# Patient Record
Sex: Female | Born: 1955 | Hispanic: No | Marital: Married | Smoking: Never smoker
Health system: Southern US, Community
[De-identification: ages and names within clinical notes are randomized; demographics above are authoritative.]

## PROBLEM LIST (undated history)

## (undated) DIAGNOSIS — E785 Hyperlipidemia, unspecified: Secondary | ICD-10-CM

## (undated) DIAGNOSIS — I10 Essential (primary) hypertension: Secondary | ICD-10-CM

## (undated) DIAGNOSIS — M810 Age-related osteoporosis without current pathological fracture: Secondary | ICD-10-CM

## (undated) DIAGNOSIS — K5792 Diverticulitis of intestine, part unspecified, without perforation or abscess without bleeding: Secondary | ICD-10-CM

## (undated) DIAGNOSIS — K219 Gastro-esophageal reflux disease without esophagitis: Secondary | ICD-10-CM

## (undated) DIAGNOSIS — G4730 Sleep apnea, unspecified: Secondary | ICD-10-CM

## (undated) DIAGNOSIS — I5189 Other ill-defined heart diseases: Secondary | ICD-10-CM

## (undated) DIAGNOSIS — N200 Calculus of kidney: Secondary | ICD-10-CM

## (undated) DIAGNOSIS — D069 Carcinoma in situ of cervix, unspecified: Secondary | ICD-10-CM

## (undated) HISTORY — PX: TONSILLECTOMY: SHX5217

## (undated) HISTORY — DX: Calculus of kidney: N20.0

## (undated) HISTORY — DX: Age-related osteoporosis without current pathological fracture: M81.0

## (undated) HISTORY — PX: OTHER SURGICAL HISTORY: SHX169

## (undated) HISTORY — DX: Essential (primary) hypertension: I10

## (undated) HISTORY — PX: EYE SURGERY: SHX253

## (undated) HISTORY — DX: Other ill-defined heart diseases: I51.89

## (undated) HISTORY — DX: Sleep apnea, unspecified: G47.30

## (undated) HISTORY — DX: Gastro-esophageal reflux disease without esophagitis: K21.9

## (undated) HISTORY — PX: ECTOPIC PREGNANCY SURGERY: SHX613

## (undated) HISTORY — DX: Carcinoma in situ of cervix, unspecified: D06.9

---

## 1987-07-29 DIAGNOSIS — D069 Carcinoma in situ of cervix, unspecified: Secondary | ICD-10-CM

## 1987-07-29 HISTORY — DX: Carcinoma in situ of cervix, unspecified: D06.9

## 1987-07-29 HISTORY — PX: CERVICAL CONE BIOPSY: SUR198

## 2008-01-24 ENCOUNTER — Other Ambulatory Visit: Admission: RE | Admit: 2008-01-24 | Discharge: 2008-01-24 | Payer: Self-pay | Admitting: Gynecology

## 2008-03-28 ENCOUNTER — Ambulatory Visit: Payer: Self-pay | Admitting: Gynecology

## 2008-07-18 ENCOUNTER — Emergency Department (HOSPITAL_BASED_OUTPATIENT_CLINIC_OR_DEPARTMENT_OTHER): Admission: EM | Admit: 2008-07-18 | Discharge: 2008-07-18 | Payer: Self-pay | Admitting: Emergency Medicine

## 2008-07-18 ENCOUNTER — Ambulatory Visit: Payer: Self-pay | Admitting: Diagnostic Radiology

## 2009-01-31 ENCOUNTER — Other Ambulatory Visit: Admission: RE | Admit: 2009-01-31 | Discharge: 2009-01-31 | Payer: Self-pay | Admitting: Gynecology

## 2009-01-31 ENCOUNTER — Encounter: Payer: Self-pay | Admitting: Gynecology

## 2009-01-31 ENCOUNTER — Ambulatory Visit: Payer: Self-pay | Admitting: Gynecology

## 2010-02-06 ENCOUNTER — Ambulatory Visit: Payer: Self-pay | Admitting: Gynecology

## 2010-02-06 ENCOUNTER — Other Ambulatory Visit: Admission: RE | Admit: 2010-02-06 | Discharge: 2010-02-06 | Payer: Self-pay | Admitting: Gynecology

## 2010-04-02 ENCOUNTER — Encounter: Admission: RE | Admit: 2010-04-02 | Discharge: 2010-04-02 | Payer: Self-pay | Admitting: Gastroenterology

## 2010-12-25 ENCOUNTER — Other Ambulatory Visit: Payer: Self-pay | Admitting: Orthopedic Surgery

## 2010-12-25 DIAGNOSIS — M25512 Pain in left shoulder: Secondary | ICD-10-CM

## 2010-12-29 ENCOUNTER — Other Ambulatory Visit: Payer: Self-pay

## 2010-12-31 ENCOUNTER — Ambulatory Visit: Payer: BC Managed Care – PPO | Attending: Orthopedic Surgery | Admitting: Rehabilitation

## 2010-12-31 DIAGNOSIS — IMO0001 Reserved for inherently not codable concepts without codable children: Secondary | ICD-10-CM | POA: Insufficient documentation

## 2010-12-31 DIAGNOSIS — M6281 Muscle weakness (generalized): Secondary | ICD-10-CM | POA: Insufficient documentation

## 2011-01-01 ENCOUNTER — Ambulatory Visit
Admission: RE | Admit: 2011-01-01 | Discharge: 2011-01-01 | Disposition: A | Discharge status: Home / Self Care | Payer: BC Managed Care – PPO | Source: Ambulatory Visit | Attending: Orthopedic Surgery | Admitting: Orthopedic Surgery

## 2011-01-01 DIAGNOSIS — M25512 Pain in left shoulder: Secondary | ICD-10-CM

## 2011-01-08 ENCOUNTER — Ambulatory Visit: Payer: BC Managed Care – PPO | Admitting: Physical Therapy

## 2011-01-22 ENCOUNTER — Ambulatory Visit: Payer: BC Managed Care – PPO | Admitting: Physical Therapy

## 2011-02-10 ENCOUNTER — Encounter (INDEPENDENT_AMBULATORY_CARE_PROVIDER_SITE_OTHER): Payer: BC Managed Care – PPO | Admitting: Gynecology

## 2011-02-10 ENCOUNTER — Other Ambulatory Visit (HOSPITAL_COMMUNITY)
Admission: RE | Admit: 2011-02-10 | Discharge: 2011-02-10 | Disposition: A | Discharge status: Home / Self Care | Payer: BC Managed Care – PPO | Source: Ambulatory Visit | Attending: Gynecology | Admitting: Gynecology

## 2011-02-10 ENCOUNTER — Other Ambulatory Visit: Payer: Self-pay | Admitting: Gynecology

## 2011-02-10 ENCOUNTER — Encounter: Payer: Self-pay | Admitting: Gynecology

## 2011-02-10 DIAGNOSIS — Z124 Encounter for screening for malignant neoplasm of cervix: Secondary | ICD-10-CM | POA: Insufficient documentation

## 2011-02-10 DIAGNOSIS — Z01419 Encounter for gynecological examination (general) (routine) without abnormal findings: Secondary | ICD-10-CM

## 2011-02-10 DIAGNOSIS — B373 Candidiasis of vulva and vagina: Secondary | ICD-10-CM

## 2011-03-04 ENCOUNTER — Ambulatory Visit: Payer: BC Managed Care – PPO | Attending: Orthopedic Surgery | Admitting: Physical Therapy

## 2011-03-04 DIAGNOSIS — M6281 Muscle weakness (generalized): Secondary | ICD-10-CM | POA: Insufficient documentation

## 2011-03-04 DIAGNOSIS — IMO0001 Reserved for inherently not codable concepts without codable children: Secondary | ICD-10-CM | POA: Insufficient documentation

## 2011-03-05 ENCOUNTER — Encounter (HOSPITAL_BASED_OUTPATIENT_CLINIC_OR_DEPARTMENT_OTHER): Payer: Self-pay

## 2011-03-05 ENCOUNTER — Inpatient Hospital Stay (HOSPITAL_COMMUNITY)
Admission: AD | Admit: 2011-03-05 | Discharge: 2011-03-12 | DRG: 541 | Disposition: A | Discharge status: Home / Self Care | Payer: BC Managed Care – PPO | Source: Other Acute Inpatient Hospital | Attending: Internal Medicine | Admitting: Internal Medicine

## 2011-03-05 ENCOUNTER — Emergency Department (INDEPENDENT_AMBULATORY_CARE_PROVIDER_SITE_OTHER): Payer: BC Managed Care – PPO

## 2011-03-05 ENCOUNTER — Other Ambulatory Visit: Payer: Self-pay

## 2011-03-05 ENCOUNTER — Encounter: Payer: BC Managed Care – PPO | Admitting: Physical Therapy

## 2011-03-05 ENCOUNTER — Emergency Department (HOSPITAL_BASED_OUTPATIENT_CLINIC_OR_DEPARTMENT_OTHER)
Admission: EM | Admit: 2011-03-05 | Discharge: 2011-03-05 | Disposition: A | Discharge status: Other Acute Inpatient Hospital | Payer: BC Managed Care – PPO | Source: Home / Self Care | Attending: Emergency Medicine | Admitting: Emergency Medicine

## 2011-03-05 DIAGNOSIS — R4182 Altered mental status, unspecified: Secondary | ICD-10-CM

## 2011-03-05 DIAGNOSIS — E119 Type 2 diabetes mellitus without complications: Secondary | ICD-10-CM | POA: Diagnosis present

## 2011-03-05 DIAGNOSIS — E785 Hyperlipidemia, unspecified: Secondary | ICD-10-CM | POA: Insufficient documentation

## 2011-03-05 DIAGNOSIS — R404 Transient alteration of awareness: Secondary | ICD-10-CM | POA: Diagnosis present

## 2011-03-05 DIAGNOSIS — F29 Unspecified psychosis not due to a substance or known physiological condition: Secondary | ICD-10-CM

## 2011-03-05 DIAGNOSIS — I5031 Acute diastolic (congestive) heart failure: Secondary | ICD-10-CM | POA: Diagnosis present

## 2011-03-05 DIAGNOSIS — J189 Pneumonia, unspecified organism: Principal | ICD-10-CM | POA: Diagnosis present

## 2011-03-05 DIAGNOSIS — N179 Acute kidney failure, unspecified: Secondary | ICD-10-CM | POA: Diagnosis present

## 2011-03-05 DIAGNOSIS — F341 Dysthymic disorder: Secondary | ICD-10-CM | POA: Diagnosis present

## 2011-03-05 DIAGNOSIS — R4789 Other speech disturbances: Secondary | ICD-10-CM | POA: Insufficient documentation

## 2011-03-05 DIAGNOSIS — K219 Gastro-esophageal reflux disease without esophagitis: Secondary | ICD-10-CM | POA: Diagnosis present

## 2011-03-05 DIAGNOSIS — D069 Carcinoma in situ of cervix, unspecified: Secondary | ICD-10-CM | POA: Insufficient documentation

## 2011-03-05 DIAGNOSIS — J9600 Acute respiratory failure, unspecified whether with hypoxia or hypercapnia: Secondary | ICD-10-CM | POA: Insufficient documentation

## 2011-03-05 DIAGNOSIS — I509 Heart failure, unspecified: Secondary | ICD-10-CM | POA: Diagnosis present

## 2011-03-05 DIAGNOSIS — Z9181 History of falling: Secondary | ICD-10-CM

## 2011-03-05 DIAGNOSIS — I10 Essential (primary) hypertension: Secondary | ICD-10-CM | POA: Diagnosis present

## 2011-03-05 DIAGNOSIS — R0902 Hypoxemia: Secondary | ICD-10-CM | POA: Diagnosis present

## 2011-03-05 DIAGNOSIS — Y95 Nosocomial condition: Secondary | ICD-10-CM

## 2011-03-05 DIAGNOSIS — Z4889 Encounter for other specified surgical aftercare: Secondary | ICD-10-CM

## 2011-03-05 HISTORY — DX: Hyperlipidemia, unspecified: E78.5

## 2011-03-05 LAB — URINALYSIS, ROUTINE W REFLEX MICROSCOPIC
Bilirubin Urine: NEGATIVE
Glucose, UA: NEGATIVE mg/dL
Hgb urine dipstick: NEGATIVE
Ketones, ur: NEGATIVE mg/dL
Leukocytes, UA: NEGATIVE
Nitrite: NEGATIVE
Protein, ur: NEGATIVE mg/dL
Specific Gravity, Urine: 1.014 (ref 1.005–1.030)
Urobilinogen, UA: 0.2 mg/dL (ref 0.0–1.0)
pH: 5.5 (ref 5.0–8.0)

## 2011-03-05 LAB — COMPREHENSIVE METABOLIC PANEL WITH GFR
ALT: 18 U/L (ref 0–35)
AST: 23 U/L (ref 0–37)
Albumin: 3.4 g/dL — ABNORMAL LOW (ref 3.5–5.2)
Alkaline Phosphatase: 99 U/L (ref 39–117)
BUN: 21 mg/dL (ref 6–23)
CO2: 25 meq/L (ref 19–32)
Calcium: 9.3 mg/dL (ref 8.4–10.5)
Chloride: 102 meq/L (ref 96–112)
Creatinine, Ser: 1.4 mg/dL — ABNORMAL HIGH (ref 0.50–1.10)
GFR calc Af Amer: 47 mL/min — ABNORMAL LOW (ref >60)
GFR calc non Af Amer: 39 mL/min — ABNORMAL LOW (ref >60)
Glucose, Bld: 117 mg/dL — ABNORMAL HIGH (ref 70–99)
Potassium: 3.8 meq/L (ref 3.5–5.1)
Sodium: 138 meq/L (ref 135–145)
Total Bilirubin: 0.3 mg/dL (ref 0.3–1.2)
Total Protein: 7.1 g/dL (ref 6.0–8.3)

## 2011-03-05 LAB — CBC
HCT: 36.3 % (ref 36.0–46.0)
Hemoglobin: 11.3 g/dL — ABNORMAL LOW (ref 12.0–15.0)
MCH: 27.4 pg (ref 26.0–34.0)
MCHC: 31.1 g/dL (ref 30.0–36.0)
MCV: 87.9 fL (ref 78.0–100.0)
Platelets: 247 K/uL (ref 150–400)
RBC: 4.13 MIL/uL (ref 3.87–5.11)
RDW: 17.4 % — ABNORMAL HIGH (ref 11.5–15.5)
WBC: 12.9 K/uL — ABNORMAL HIGH (ref 4.0–10.5)

## 2011-03-05 LAB — POCT I-STAT 3, ART BLOOD GAS (G3+)
Acid-Base Excess: 1 mmol/L (ref 0.0–2.0)
Bicarbonate: 26.3 mEq/L — ABNORMAL HIGH (ref 20.0–24.0)
Bicarbonate: 27 mEq/L — ABNORMAL HIGH (ref 20.0–24.0)
O2 Saturation: 90 %
Patient temperature: 99.3
TCO2: 28 mmol/L (ref 0–100)
TCO2: 28 mmol/L (ref 0–100)
pCO2 arterial: 51.2 mmHg — ABNORMAL HIGH (ref 35.0–45.0)
pH, Arterial: 7.322 — ABNORMAL LOW (ref 7.350–7.400)
pO2, Arterial: 66 mmHg — ABNORMAL LOW (ref 80.0–100.0)
pO2, Arterial: 78 mmHg — ABNORMAL LOW (ref 80.0–100.0)

## 2011-03-05 LAB — CK TOTAL AND CKMB (NOT AT ARMC): CK, MB: 5.3 ng/mL — ABNORMAL HIGH (ref 0.3–4.0)

## 2011-03-05 MED ORDER — SODIUM CHLORIDE 0.9 % IV SOLN: INTRAVENOUS | Status: DC

## 2011-03-05 MED ORDER — PIPERACILLIN-TAZOBACTAM 3.375 G IVPB
3.3750 g | Freq: Once | INTRAVENOUS | Status: AC
  Administered 2011-03-05: 3.375 g via INTRAVENOUS
  Filled 2011-03-05: qty 50

## 2011-03-05 MED ORDER — ONDANSETRON HCL 4 MG/2ML IJ SOLN
4.0000 mg | Freq: Once | INTRAMUSCULAR | Status: AC
  Administered 2011-03-05: 4 mg via INTRAVENOUS

## 2011-03-05 MED ORDER — VANCOMYCIN HCL IN DEXTROSE 1-5 GM/200ML-% IV SOLN
1000.0000 mg | Freq: Once | INTRAVENOUS | Status: AC
  Administered 2011-03-05: 1000 mg via INTRAVENOUS
  Filled 2011-03-05: qty 200

## 2011-03-05 MED ORDER — ONDANSETRON HCL 4 MG/2ML IJ SOLN
INTRAMUSCULAR | Status: AC
  Administered 2011-03-05: 4 mg via INTRAVENOUS
  Filled 2011-03-05: qty 2

## 2011-03-05 NOTE — ED Notes (Signed)
Returned from radiology.  Pt remains in NSR.  O2 increased to 4LNC.  Pt follows simple commands.  No change in condition otherwise. Family at bedside and informed of plan of care.  RRT at bedside for ABG.

## 2011-03-05 NOTE — ED Notes (Signed)
IV was charted "removed" for documentation purposes only. IV is infact intact at time of transfer. SL. Flushes well.

## 2011-03-05 NOTE — ED Notes (Signed)
Patient is resting comfortably. 

## 2011-03-05 NOTE — ED Notes (Signed)
Report given to [facility] and husband signed transfer consent form.

## 2011-03-05 NOTE — ED Notes (Signed)
Blood culture #2 collected by RRT via ABG specimen.  Pt tolerated well.  IV ABX started.

## 2011-03-05 NOTE — ED Notes (Signed)
Report received from [facility]. Assuming care of pt at this time. Pt is lying in bed with family members at side. Pt remains disoriented to time, place and situation. Pt does recognize family members. Vitals stable. Warm blankets applied. Pt/family aware of plans for admission.

## 2011-03-05 NOTE — ED Notes (Signed)
Pt transported by [facility] to room 2927

## 2011-03-05 NOTE — ED Notes (Signed)
Unable to call report at his time d/t unit nurse is unavailable. Nurse requested to call back in approx 15-90min.

## 2011-03-05 NOTE — ED Notes (Signed)
Family called staff to room stating pt was vomiting. Pt had small amt emesis in the bag. HOB elevated. Pt states she feels nauseous. Md made aware and orders given. zofran administered. Will continue to monitor. Pt feels warm to touch. Will check temp once vomiting subsides.

## 2011-03-05 NOTE — ED Notes (Signed)
Pt resting comfortably and family at bedside.  NSR per monitor.  Awaiting admission. Pt repositioned for comfort.

## 2011-03-05 NOTE — ED Notes (Signed)
Report given to RN on 2900.

## 2011-03-05 NOTE — ED Notes (Signed)
Dressing to left shoulder CDI and sling in place.  Radial pulse palpable.

## 2011-03-05 NOTE — ED Notes (Signed)
MD at bedside. Blood Culture x 1 collected.  Pt and family at bedside and informed of plan of care. Awaiting bed assignment.

## 2011-03-05 NOTE — ED Notes (Signed)
Call placed to [facility] hospitalist via [facility]

## 2011-03-05 NOTE — ED Notes (Signed)
Family reports pt is s/p left shoulder surgery Monday and has been home recovering.  Husbanfd reports last dose of Percocet was at 0600 this am and pt was last known well at approximately 1000.  Pt was awakened at 1300 for therapy and was confused.

## 2011-03-05 NOTE — ED Notes (Signed)
NSR per cardiac monitor.  Pt resting comfortably and no change in condition.

## 2011-03-05 NOTE — ED Provider Notes (Addendum)
History     CSN: [id number] Arrival date & time: 03/05/2011  2:45 PM  Chief Complaint  Patient presents with  . Altered Mental Status   The history is provided by the patient, the spouse and a relative.  S/P SHOULDER SURGERY ON Monday REQUIRING OVERNIGHT STAY BY DR MURPHY. TODAY AT 0600 NOTED TO BE CONFUSED BUT THOUGHT MAYBE PAIN MEDS BY AFTERNOON WITHOUT PAIN MEDS STILL CONFUSED. NO OTHER SYMPTOMS.  NO CP NO SOB, NO FEVER, NO NEURO DEFICIT.   Past Medical History  Diagnosis Date  . GERD (gastroesophageal reflux disease)   . Hypertension   . Diabetes mellitus   . CIS (carcinoma in situ of cervix) 1989    [facility] BX WITH DR. MCPHAIL  . Ectopic pregnancy 1985  . Diabetes mellitus   . Hyperlipemia     Past Surgical History  Procedure Date  . Eye surgery [id number]  . Cervical cone biopsy 1989     DR. MCPHAIL  . Left shoulder surgery   . Eye surgery   . Cesarean section   . Tonsillectomy     History reviewed. No pertinent family history.  History  Substance Use Topics  . Smoking status: Unknown If Ever Smoked  . Smokeless tobacco: Not on file  . Alcohol Use: No    OB History    Grav Para Term Preterm Abortions TAB SAB Ect Mult Living                  Review of Systems  Constitutional: Negative for fever.  HENT: Negative for congestion and neck pain.   Eyes: Negative for redness and visual disturbance.  Respiratory: Negative for cough, shortness of breath and wheezing.   Cardiovascular: Negative for chest pain and leg swelling.  Gastrointestinal: Negative for nausea, vomiting and abdominal pain.  Genitourinary: Negative for dysuria.  Musculoskeletal: Negative for myalgias and back pain.  Skin: Negative for rash.  Neurological: Positive for speech difficulty. Negative for seizures, weakness, numbness and headaches.  Psychiatric/Behavioral: Positive for confusion.    Physical Exam  BP 117/64  Pulse 90  Temp(Src) 99.3 F (37.4 C) (Oral)  Resp 16  Ht 5\' 2"   (1.575 m)  Wt 210 lb (95.255 kg)  BMI 38.41 kg/m2  SpO2 91%  Physical Exam  Nursing note and vitals reviewed. Constitutional: She appears well-developed and well-nourished.  HENT:  Head: Atraumatic.  Mouth/Throat: Oropharynx is clear and moist.  Eyes: Conjunctivae and EOM are normal. Pupils are equal, round, and reactive to light. Left eye exhibits no discharge. No scleral icterus.  Neck: Normal range of motion. Neck supple.  Cardiovascular: Normal rate, regular rhythm and normal heart sounds.   No murmur heard. Pulmonary/Chest: Effort normal and breath sounds normal. She has no wheezes. She exhibits no tenderness.  Abdominal: Soft. Bowel sounds are normal. There is no tenderness.  Musculoskeletal: Normal range of motion. She exhibits no edema.       LEFT SHOULDER IN [place].   Neurological: She is alert. She displays normal reflexes. No cranial nerve deficit. She exhibits normal muscle tone. Coordination normal.  Skin: Skin is warm and dry. No rash noted.    ED Course  CRITICAL CARE Performed by: Shelda Jakes. Authorized by: Shelda Jakes. Total critical care time: 30 minutes Critical care time was exclusive of separately billable procedures and treating other patients. Critical care was necessary to treat or prevent imminent or life-threatening deterioration of the following conditions: respiratory failure. Critical care was time spent personally by me  on the following activities: evaluation of patient's response to treatment, discussions with consultants, obtaining history from patient or surrogate, ordering and review of radiographic studies, ordering and review of laboratory studies, development of treatment plan with patient or surrogate and examination of patient.    REPEAT ABG AFTER BEING ON 4 LITERS OF OXYGEN FOR SOME TIME AND PH NOW NORMAL AND PCO2 IMPROVING BUT STILL ELEVATED AND PO2 BETTER. RX FOR HOSPITAL ACQUIRED PNEUMONIA WITH VANC AND ZOSYN AND BLOOD  CULTURES DONE BEFORE HAND. WILL NEED ADMISSION. PCM IS DR ROSS SO [facility] WILL ADMIT. ALSO MENTAL STATUS IMPROVING WITH BETTER MEMORY RECALL BUT NOT YET BACK TO BASELINE.    Date: 03/05/2011  Rate: 92  Rhythm: normal sinus rhythm  QRS Axis: normal  Intervals: normal  ST/T Wave abnormalities: normal  Conduction Disutrbances:none  Narrative Interpretation:   Old EKG Reviewed: none available  MDM SINCE KEPT IN HOPSITAL OVERNIGHT WILL TREAT AS HCAP. TRAID TO ADMIT. CARDIAC MARKERS NEGATIVE, ? FLIUID OVERLOAD BUT DEFINITE PNEUMONIA. BNP SENT.   Results for orders placed during the hospital encounter of 03/05/11  CBC      Component Value Range   WBC 12.9 (*) 4.0 - 10.5 (K/uL)   RBC 4.13  3.87 - 5.11 (MIL/uL)   Hemoglobin 11.3 (*) 12.0 - 15.0 (g/dL)   HCT 11.9  14.7 - 82.9 (%)   MCV 87.9  78.0 - 100.0 (fL)   MCH 27.4  26.0 - 34.0 (pg)   MCHC 31.1  30.0 - 36.0 (g/dL)   RDW 56.2 (*) 13.0 - 15.5 (%)   Platelets 247  150 - 400 (K/uL)  COMPREHENSIVE METABOLIC PANEL      Component Value Range   Sodium 138  135 - 145 (mEq/L)   Potassium 3.8  3.5 - 5.1 (mEq/L)   Chloride 102  96 - 112 (mEq/L)   CO2 25  19 - 32 (mEq/L)   Glucose, Bld 117 (*) 70 - 99 (mg/dL)   BUN 21  6 - 23 (mg/dL)   Creatinine, Ser 8.65 (*) 0.50 - 1.10 (mg/dL)   Calcium 9.3  8.4 - 78.4 (mg/dL)   Total Protein 7.1  6.0 - 8.3 (g/dL)   Albumin 3.4 (*) 3.5 - 5.2 (g/dL)   AST 23  0 - 37 (U/L)   ALT 18  0 - 35 (U/L)   Alkaline Phosphatase 99  39 - 117 (U/L)   Total Bilirubin 0.3  0.3 - 1.2 (mg/dL)   GFR calc non Af Amer 39 (*) >60 (mL/min)   GFR calc Af Amer 47 (*) >60 (mL/min)  URINALYSIS, ROUTINE W REFLEX MICROSCOPIC      Component Value Range   Color, Urine YELLOW  YELLOW    Appearance CLEAR  CLEAR    Specific Gravity, Urine 1.014  1.005 - 1.030    pH 5.5  5.0 - 8.0    Glucose, UA NEGATIVE  NEGATIVE (mg/dL)   Hgb urine dipstick NEGATIVE  NEGATIVE    Bilirubin Urine NEGATIVE  NEGATIVE    Ketones, ur NEGATIVE   NEGATIVE (mg/dL)   Protein, ur NEGATIVE  NEGATIVE (mg/dL)   Urobilinogen, UA 0.2  0.0 - 1.0 (mg/dL)   Nitrite NEGATIVE  NEGATIVE    Leukocytes, UA NEGATIVE  NEGATIVE   D-DIMER, QUANTITATIVE      Component Value Range   D-Dimer, Quant 0.27  0.00 - 0.48 (ug/mL-FEU)  CK TOTAL AND CKMB      Component Value Range   Total CK 209 (*) 7 -  177 (U/L)   CK, MB 5.3 (*) 0.3 - 4.0 (ng/mL)   Relative Index 2.5  0.0 - 2.5   TROPONIN I      Component Value Range   Troponin I <0.30  <0.30 (ng/mL)  POCT I-STAT 3, BLOOD GAS (G3+)      Component Value Range   pH, Arterial 7.322 (*) 7.350 - 7.400    pCO2 51.2 (*) 35.0 - 45.0 (mmHg)   pO2, Arterial 66.0 (*) 80.0 - 100.0 (mmHg)   Bicarbonate 26.3 (*) 20.0 - 24.0 (mEq/L)   TCO2 28  0 - 100 (mmol/L)   O2 Saturation 90.0     Patient temperature 99.3 F     Collection site RADIAL, ALLEN'S TEST ACCEPTABLE     Drawn by RT     Sample type ARTERIAL    POCT I-STAT 3, BLOOD GAS (G3+)      Component Value Range   pH, Arterial 7.340 (*) 7.350 - 7.400    pCO2 50.2 (*) 35.0 - 45.0 (mmHg)   pO2, Arterial 78.0 (*) 80.0 - 100.0 (mmHg)   Bicarbonate 27.0 (*) 20.0 - 24.0 (mEq/L)   TCO2 28  0 - 100 (mmol/L)   O2 Saturation 94.0     Acid-Base Excess 1.0  0.0 - 2.0 (mmol/L)   Patient temperature 99.3 F     Collection site RADIAL, ALLEN'S TEST ACCEPTABLE     Drawn by RT     Sample type ARTERIAL     Results for orders placed during the hospital encounter of 03/05/11  CBC      Component Value Range   WBC 12.9 (*) 4.0 - 10.5 (K/uL)   RBC 4.13  3.87 - 5.11 (MIL/uL)   Hemoglobin 11.3 (*) 12.0 - 15.0 (g/dL)   HCT 16.1  09.6 - 04.5 (%)   MCV 87.9  78.0 - 100.0 (fL)   MCH 27.4  26.0 - 34.0 (pg)   MCHC 31.1  30.0 - 36.0 (g/dL)   RDW 40.9 (*) 81.1 - 15.5 (%)   Platelets 247  150 - 400 (K/uL)  COMPREHENSIVE METABOLIC PANEL      Component Value Range   Sodium 138  135 - 145 (mEq/L)   Potassium 3.8  3.5 - 5.1 (mEq/L)   Chloride 102  96 - 112 (mEq/L)   CO2 25  19 - 32  (mEq/L)   Glucose, Bld 117 (*) 70 - 99 (mg/dL)   BUN 21  6 - 23 (mg/dL)   Creatinine, Ser 9.14 (*) 0.50 - 1.10 (mg/dL)   Calcium 9.3  8.4 - 78.2 (mg/dL)   Total Protein 7.1  6.0 - 8.3 (g/dL)   Albumin 3.4 (*) 3.5 - 5.2 (g/dL)   AST 23  0 - 37 (U/L)   ALT 18  0 - 35 (U/L)   Alkaline Phosphatase 99  39 - 117 (U/L)   Total Bilirubin 0.3  0.3 - 1.2 (mg/dL)   GFR calc non Af Amer 39 (*) >60 (mL/min)   GFR calc Af Amer 47 (*) >60 (mL/min)  URINALYSIS, ROUTINE W REFLEX MICROSCOPIC      Component Value Range   Color, Urine YELLOW  YELLOW    Appearance CLEAR  CLEAR    Specific Gravity, Urine 1.014  1.005 - 1.030    pH 5.5  5.0 - 8.0    Glucose, UA NEGATIVE  NEGATIVE (mg/dL)   Hgb urine dipstick NEGATIVE  NEGATIVE    Bilirubin Urine NEGATIVE  NEGATIVE    Ketones, ur NEGATIVE  NEGATIVE (mg/dL)   Protein, ur NEGATIVE  NEGATIVE (mg/dL)   Urobilinogen, UA 0.2  0.0 - 1.0 (mg/dL)   Nitrite NEGATIVE  NEGATIVE    Leukocytes, UA NEGATIVE  NEGATIVE   D-DIMER, QUANTITATIVE      Component Value Range   D-Dimer, Quant 0.27  0.00 - 0.48 (ug/mL-FEU)  CK TOTAL AND CKMB      Component Value Range   Total CK 209 (*) 7 - 177 (U/L)   CK, MB 5.3 (*) 0.3 - 4.0 (ng/mL)   Relative Index 2.5  0.0 - 2.5   TROPONIN I      Component Value Range   Troponin I <0.30  <0.30 (ng/mL)  POCT I-STAT 3, BLOOD GAS (G3+)      Component Value Range   pH, Arterial 7.322 (*) 7.350 - 7.400    pCO2 51.2 (*) 35.0 - 45.0 (mmHg)   pO2, Arterial 66.0 (*) 80.0 - 100.0 (mmHg)   Bicarbonate 26.3 (*) 20.0 - 24.0 (mEq/L)   TCO2 28  0 - 100 (mmol/L)   O2 Saturation 90.0     Patient temperature 99.3 F     Collection site RADIAL, ALLEN'S TEST ACCEPTABLE     Drawn by RT     Sample type ARTERIAL    POCT I-STAT 3, BLOOD GAS (G3+)      Component Value Range   pH, Arterial 7.340 (*) 7.350 - 7.400    pCO2 50.2 (*) 35.0 - 45.0 (mmHg)   pO2, Arterial 78.0 (*) 80.0 - 100.0 (mmHg)   Bicarbonate 27.0 (*) 20.0 - 24.0 (mEq/L)   TCO2 28  0  - 100 (mmol/L)   O2 Saturation 94.0     Acid-Base Excess 1.0  0.0 - 2.0 (mmol/L)   Patient temperature 99.3 F     Collection site RADIAL, ALLEN'S TEST ACCEPTABLE     Drawn by RT     Sample type ARTERIAL     Dg Chest 2 View  03/05/2011  *RADIOLOGY REPORT*  Clinical Data: Altered mental status.  CHEST - 2 VIEW  Comparison: None.  Findings: Lung volumes are markedly low. The cardiopericardial silhouette is enlarged. There is pulmonary vascular congestion without overt pulmonary edema.  There is patchy airspace disease in the peripheral left midlung. Imaged bony structures of the thorax are intact. Telemetry leads overlie the chest.  IMPRESSION: Markedly low lung volumes with vascular congestion and vascular crowding.  Patchy airspace disease in the left peripheral midlung, suspicious for pneumonia.  Follow-up imaging is recommended to ensure resolution.  Original Report Authenticated By: ERIC A. MANSELL, M.D.   Ct Head Wo Contrast  03/05/2011  *RADIOLOGY REPORT*  Clinical Data:  Altered mental status.  Confusion.  CT HEAD WITHOUT CONTRAST  Technique: Contiguous axial images were obtained from the base of the skull through the vertex without contrast  Comparison:  None  Findings:  There is no evidence of brain mass, brain hemorrhage, or acute infarction.  The ventricular system is normal size and shape.  There is no evidence of shift of midline structures, parenchymal lesion, or subdural or epidural hematoma.  The calvarium is intact.  Mastoids are well aerated.  No sinusitis is evident. There is mild nasal septal deviation to the right.  IMPRESSION: There is no evidence of brain mass, brain hemorrhage, or acute infarction.  No acute or active process is seen.  No skull lesion is evident. No sinusitis is evident.  Original Report Authenticated By: Crawford Givens, M.D.     ACCEPTED BY  [facility] TEAM 2 AT [facility]. TO STEPDOWN.   Shelda Jakes, MD 03/05/11 2025     Shelda Jakes,  MD 04/01/11 [phone]

## 2011-03-06 ENCOUNTER — Encounter: Payer: BC Managed Care – PPO | Admitting: Physical Therapy

## 2011-03-06 LAB — CARDIAC PANEL(CRET KIN+CKTOT+MB+TROPI)
CK, MB: 2.6 ng/mL (ref 0.3–4.0)
CK, MB: 2.1 ng/mL (ref 0.3–4.0)
Relative Index: INVALID (ref 0.0–2.5)
Total CK: 91 U/L (ref 7–177)
Total CK: 67 U/L (ref 7–177)
Troponin I: <0.3 ng/mL (ref <0.30)
Troponin I: <0.3 ng/mL (ref <0.30)

## 2011-03-06 LAB — COMPREHENSIVE METABOLIC PANEL
Albumin: 3 g/dL — ABNORMAL LOW (ref 3.5–5.2)
BUN: 13 mg/dL (ref 6–23)
Calcium: 8.9 mg/dL (ref 8.4–10.5)
GFR calc Af Amer: >60 mL/min (ref >60)
Glucose, Bld: 113 mg/dL — ABNORMAL HIGH (ref 70–99)
Sodium: 140 mEq/L (ref 135–145)
Total Protein: 6.6 g/dL (ref 6.0–8.3)

## 2011-03-06 LAB — CBC
Hemoglobin: 10.8 g/dL — ABNORMAL LOW (ref 12.0–15.0)
MCHC: 31.5 g/dL (ref 30.0–36.0)
RDW: 16.6 % — ABNORMAL HIGH (ref 11.5–15.5)
WBC: 11.4 10*3/uL — ABNORMAL HIGH (ref 4.0–10.5)

## 2011-03-06 LAB — LIPID PANEL
Cholesterol: 140 mg/dL (ref 0–200)
HDL: 48 mg/dL (ref >39)
Total CHOL/HDL Ratio: 2.9 RATIO

## 2011-03-06 LAB — GLUCOSE, CAPILLARY
Glucose-Capillary: 116 mg/dL — ABNORMAL HIGH (ref 70–99)
Glucose-Capillary: 132 mg/dL — ABNORMAL HIGH (ref 70–99)
Glucose-Capillary: 163 mg/dL — ABNORMAL HIGH (ref 70–99)

## 2011-03-06 LAB — MRSA PCR SCREENING: MRSA by PCR: NEGATIVE

## 2011-03-07 ENCOUNTER — Encounter: Payer: BC Managed Care – PPO | Admitting: Physical Therapy

## 2011-03-07 LAB — GLUCOSE, CAPILLARY: Glucose-Capillary: 97 mg/dL (ref 70–99)

## 2011-03-08 LAB — GLUCOSE, CAPILLARY

## 2011-03-09 ENCOUNTER — Inpatient Hospital Stay (HOSPITAL_COMMUNITY): Payer: BC Managed Care – PPO

## 2011-03-09 LAB — BASIC METABOLIC PANEL
BUN: 12 mg/dL (ref 6–23)
CO2: 36 mEq/L — ABNORMAL HIGH (ref 19–32)
Chloride: 99 mEq/L (ref 96–112)
Glucose, Bld: 122 mg/dL — ABNORMAL HIGH (ref 70–99)
Potassium: 3.4 mEq/L — ABNORMAL LOW (ref 3.5–5.1)

## 2011-03-10 LAB — BASIC METABOLIC PANEL
CO2: 35 mEq/L — ABNORMAL HIGH (ref 19–32)
Chloride: 100 mEq/L (ref 96–112)
Glucose, Bld: 114 mg/dL — ABNORMAL HIGH (ref 70–99)
Potassium: 3.8 mEq/L (ref 3.5–5.1)
Sodium: 142 mEq/L (ref 135–145)

## 2011-03-10 LAB — GLUCOSE, CAPILLARY
Glucose-Capillary: 94 mg/dL (ref 70–99)
Glucose-Capillary: 114 mg/dL — ABNORMAL HIGH (ref 70–99)
Glucose-Capillary: 173 mg/dL — ABNORMAL HIGH (ref 70–99)
Glucose-Capillary: 124 mg/dL — ABNORMAL HIGH (ref 70–99)

## 2011-03-11 LAB — GLUCOSE, CAPILLARY
Glucose-Capillary: 122 mg/dL — ABNORMAL HIGH (ref 70–99)
Glucose-Capillary: 123 mg/dL — ABNORMAL HIGH (ref 70–99)

## 2011-03-12 ENCOUNTER — Inpatient Hospital Stay (HOSPITAL_COMMUNITY): Payer: BC Managed Care – PPO

## 2011-03-12 ENCOUNTER — Other Ambulatory Visit (HOSPITAL_COMMUNITY): Payer: BC Managed Care – PPO

## 2011-03-12 LAB — CULTURE, BLOOD (ROUTINE X 2)
Culture  Setup Time: 201208090129
Culture  Setup Time: 201208090942
Culture: NO GROWTH
Culture: NO GROWTH

## 2011-03-12 MED ORDER — TECHNETIUM TC 99M TETROFOSMIN IV KIT: 10.0000 | PACK | Freq: Once | INTRAVENOUS | Status: DC | PRN

## 2011-03-12 MED ORDER — TECHNETIUM TC 99M TETROFOSMIN IV KIT: 30.0000 | PACK | Freq: Once | INTRAVENOUS | Status: DC | PRN

## 2011-03-19 ENCOUNTER — Ambulatory Visit: Payer: BC Managed Care – PPO | Admitting: Physical Therapy

## 2011-03-20 ENCOUNTER — Ambulatory Visit: Payer: BC Managed Care – PPO | Admitting: Physical Therapy

## 2011-03-24 ENCOUNTER — Ambulatory Visit: Payer: BC Managed Care – PPO | Admitting: Physical Therapy

## 2011-03-26 ENCOUNTER — Ambulatory Visit: Payer: BC Managed Care – PPO | Admitting: Physical Therapy

## 2011-03-27 ENCOUNTER — Ambulatory Visit: Payer: BC Managed Care – PPO | Admitting: Physical Therapy

## 2011-04-01 ENCOUNTER — Ambulatory Visit: Payer: BC Managed Care – PPO | Attending: Orthopedic Surgery | Admitting: Physical Therapy

## 2011-04-01 DIAGNOSIS — IMO0001 Reserved for inherently not codable concepts without codable children: Secondary | ICD-10-CM | POA: Insufficient documentation

## 2011-04-01 DIAGNOSIS — M6281 Muscle weakness (generalized): Secondary | ICD-10-CM | POA: Insufficient documentation

## 2011-04-03 ENCOUNTER — Ambulatory Visit: Payer: BC Managed Care – PPO | Admitting: Physical Therapy

## 2011-04-03 NOTE — Discharge Summary (Signed)
Crystal Andrews, Crystal Andrews NO.:  [id number]  MEDICAL RECORD NO.:  [id number]  LOCATION:  3041                         FACILITY:  [facility]  PHYSICIAN:  Ramiro Harvest, MD    DATE OF BIRTH:  14-Mar-1956  DATE OF ADMISSION:  03/05/2011 DATE OF DISCHARGE:  03/12/2011                        DISCHARGE SUMMARY    ADDENDUM:  ORTHOPOD:  Loreta Ave, MD  DISCHARGE DIAGNOSES: 1. New-onset diastolic heart failure, compensated. 2. Healthcare-associated pneumonia versus atelectasis. 3. Recent left shoulder surgery. 4. Well-controlled hypertension. 5. Well-controlled type 2 diabetes. 6. Morbid obesity. 7. Hyperlipidemia. 8. History of frozen shoulder status post recent surgery.  DISCHARGE MEDICATIONS: 1. Levaquin 500 mg p.o. at bedtime x3 days. 2. Zofran 4 mg p.o. q.6 h. p.r.n. x7 days. 3. KCl 10 mEq p.o. daily. 4. Lasix 20 mg p.o. daily. 5. Ibuprofen 200 mg p.o. daily p.r.n. 6. Lexapro 20 mg p.o. daily. 7. Metformin XR 500 mg p.o. daily. 8. Omega-3 acid ethyl esters 1 gram p.o. daily. 9. Omeprazole 20 mg p.o. daily. 10.Percocet 5/325 one tablet p.o. q.6 h. p.r.n. 11.Phenergan 12.5 mg p.o. q.6 h. p.r.n. 12.Pravachol 40 mg p.o. daily.  DISPOSITION AND FOLLOWUP:  As per prior discharge summary, the patient is also to follow up with Dr. Allyson Sabal of [facility] in the next 1-2 weeks to follow up on a new onset heart failure.  The patient is to follow up with PCP in 1 week and to follow up with Dr. Eulah Pont as scheduled.  Please see prior discharge summary.  CONSULTATIONS:  A Cardiology consultation was done.  The patient was seen in consultation by Dr. Allyson Sabal on March 11, 2011.  PROCEDURES PERFORMED: 1. A Myoview Lexiscan was done on March 12, 2011 that showed no     evidence for inducible ischemia, normal left ventricular ejection     fraction. 2. A chest x-ray was done on March 09, 2011 that showed stable patchy     left mid lung opacity,  possibly aspiration/pneumonia, suspect     interstitial edema. 3. A 2-D echo was done March 10, 2011 that showed a normal systolic     function, left ventricle cavity size was normal, EF of 60-65%, wall     motion was normal, and there were no regional wall motion     abnormalities.  Features are consistent with pseudo normal left     ventricular filling pattern with concomitant abnormal relaxation     and increased filling pressure, grade 2 diastolic dysfunction.  For the rest of the procedures performed, please see prior discharge summary dictated by Dr. Cleotis Lema of job number [id number].  HOSPITAL COURSE:  The patient was seen in consultation by [facility] on March 11, 2011 secondary to a new-onset diastolic heart failure in the setting of multiple risk factors of diabetes, hypertension, and dyslipidemia.  A 2-D echo was obtained with results as stated above which showed a normal systolic function, EF of 60-65%, wall motion was normal, and there were no regional wall motion abnormalities. The patient in consultation by Cardiology was felt that due to multiple risk factors a new-onset CHF, although cardiac enzymes were negative and EKG showed  normal sinus rhythm.  It was felt that the patient will benefit from a [employer].  Lexiscan Myoview study was done on March 12, 2011, results as stated above which was negative for any inducible ischemia.  The patient improved clinically during the hospitalization such that by day of discharge the patient was not short of breath, her heart failure was compensated, and the patient was in stable condition.  The patient will be discharged home on a low-dose diuretic of Lasix 20 mg daily.  She is to follow up with Dr. Allyson Sabal of [facility] in the next 1-2 weeks to follow up on a new onset heart failure and any further recommendations in terms of that.  The patient's Actos has been discontinued.  The  patient will also follow up with her PCP as well as with her orthopod as stated on prior discharge summary.  DISCHARGE VITAL SIGNS:  Temperature 98.7, pulse of 83, respirations 16, blood pressure 109/72, saturating 92% on room air.  It has been a pleasure taking care of Crystal Andrews.  For the rest of the hospitalization, please see prior discharge summary dictated by Dr. Cleotis Lema of job number [id number].     Ramiro Harvest, MD     DT/MEDQ  D:  03/12/2011  T:  03/12/2011  Job:  [id number]  cc:   Nanetta Batty, M.D. Loreta Ave, M.D. Magnus Sinning) Tenny Craw, M.D.  Electronically Signed by Ramiro Harvest MD on 04/03/2011 12:13:06 PM

## 2011-04-08 ENCOUNTER — Ambulatory Visit: Payer: BC Managed Care – PPO | Admitting: Physical Therapy

## 2011-04-10 ENCOUNTER — Ambulatory Visit: Payer: BC Managed Care – PPO | Admitting: Physical Therapy

## 2011-04-16 ENCOUNTER — Ambulatory Visit: Payer: BC Managed Care – PPO | Admitting: Physical Therapy

## 2011-04-18 ENCOUNTER — Encounter: Payer: BC Managed Care – PPO | Admitting: Physical Therapy

## 2011-04-21 ENCOUNTER — Ambulatory Visit: Payer: BC Managed Care – PPO | Admitting: Physical Therapy

## 2011-04-23 ENCOUNTER — Ambulatory Visit: Payer: BC Managed Care – PPO | Admitting: Physical Therapy

## 2011-04-27 NOTE — Consult Note (Signed)
Crystal Andrews, ERIKSSON NO.:  [id number]  MEDICAL RECORD NO.:  [id number]  LOCATION:                                 FACILITY:  PHYSICIAN:  Nanetta Batty, M.D.   DATE OF BIRTH:  1955/10/03  DATE OF CONSULTATION:  03/11/2011 DATE OF DISCHARGE:                                CONSULTATION   HISTORY OF PRESENT ILLNESS:  The patient is a 55 year old female who we were asked to see in consult for diastolic heart failure.  She has no prior history of coronary artery disease.  She does have type 2 diabetes, hypertension, and dyslipidemia.  She recently had left shoulder rotator cuff repair and was kept overnight at the surgical center because of low oxygen levels.  Over the next 48 hours, she became confused.  She was taken to [facility] on the March 05, 2011. She was transferred to [facility].  Chest x-ray showed left effusion and there was concern she had aspiration pneumonia.  Her BNP was elevated at 1812.  She was put on diuretics as well as antibiotics and improved.  An echocardiogram done yesterday showed good LV function with grade 2 diastolic dysfunction.  The patient denies any chest pain history.  She denies any history of exertional dyspnea.  She has been active and doing well prior to this admission.  PAST MEDICAL HISTORY:  Remarkable for treated hypertension, type 2 diabetes, treated dyslipidemia, and prior tubal ligation.  HOME MEDICATIONS:  Pravachol 40 mg a day, omeprazole 20 mg a day, Micardis/HCTZ 80/25 daily, metformin 500 mg daily, Lexapro 20 mg a day, ibuprofen p.r.n., Actos had been discontinued 3 weeks prior by her primary care doctor, and Percocet p.r.n. for her shoulder pain.  ALLERGIES:  She has no known drug allergies.  SOCIAL HISTORY:  The patient is married.  She has 1 daughter living. She lives with her family.  She is a nonsmoker.  She had previously been active prior to her shoulder surgery.  FAMILY HISTORY:   Unremarkable for coronary artery disease, but positive for diabetes.  REVIEW OF SYSTEMS:  The patient denies any recent fever or chills.  She has had some lower extremity edema.  She denies any retinopathy, neuropathy, or history of nephropathy.  She does have a history of snoring.  She also says that one time her daughter mentioned that she has periods of apnea at night when she is sleeping.  PHYSICAL EXAMINATION:  VITAL SIGNS:  Blood pressure 122/60, pulse 78, temp 97.7, weight is 92 kg, height 5 feet 2 inches. GENERAL:  She is a morbidly obese female in no acute distress HEENT:  Normocephalic.  She wears glasses. NECK:  Without JVD or bruits. CHEST:  Clear to auscultation and percussion. CARDIAC:  Regular rate and rhythm without murmur, rub, or gallop. Normal S1 and S2. ABDOMEN:  Obese and nontender. EXTREMITIES:  No edema. NEUROLOGIC:  Grossly intact.  She is awake, alert, oriented, cooperative, moves all extremities without obvious deficit. SKIN:  Cool and dry.  LABORATORY DATA:  Sodium 142, potassium 3.8, BUN 13, and creatinine 0.89.  White count 11.4, hemoglobin 10.8, hematocrit 34.3, and platelets 262.  D-dimer was negative on admission at 0.27.  CK-MB and troponins were negative.  BNP on admission was 1812.  EKG shows sinus rhythm without acute changes.  IMPRESSION: 1. Acute diastolic heart failure. 2. Treated hypertension. 3. Type 2 non-insulin-dependent diabetes. 4. Treated dyslipidemia, her LDL was 75, HDL 48, total cholesterol     140. 5. Morbid obesity. 6. Suspected sleep apnea by history. 7. Recent left rotator cuff repair on March 03, 2011.  PLAN:  Ms. Crystal Andrews currently seems compensated from heart failure standpoint.  Her Actos has been discontinued.  She may need a [employer].  This has been discussed further with cardiologist today and recommendations to follow.     Abelino Derrick, P.A.   ______________________________ Nanetta Batty,  M.D.    Crystal Andrews  D:  03/11/2011  T:  03/11/2011  Job:  [id number]  cc:   Crystal Andrews) Crystal Andrews, M.D.  Electronically Signed by Crystal Andrews P.A. on 03/24/2011 09:36:02 AM Electronically Signed by Nanetta Batty M.D. on 04/27/2011 11:40:05 AM

## 2011-04-28 ENCOUNTER — Ambulatory Visit: Payer: BC Managed Care – PPO | Attending: Orthopedic Surgery | Admitting: Physical Therapy

## 2011-04-28 DIAGNOSIS — IMO0001 Reserved for inherently not codable concepts without codable children: Secondary | ICD-10-CM | POA: Insufficient documentation

## 2011-04-28 DIAGNOSIS — M6281 Muscle weakness (generalized): Secondary | ICD-10-CM | POA: Insufficient documentation

## 2011-05-01 ENCOUNTER — Ambulatory Visit: Payer: BC Managed Care – PPO | Admitting: Physical Therapy

## 2011-05-05 ENCOUNTER — Ambulatory Visit: Payer: BC Managed Care – PPO | Admitting: Physical Therapy

## 2011-05-06 NOTE — Discharge Summary (Signed)
NAMERITAJ, DULLEA NO.:  [id number]  MEDICAL RECORD NO.:  [id number]  LOCATION:  3041                         FACILITY:  [facility]  PHYSICIAN:  Baltazar Najjar, MD     DATE OF BIRTH:  1956-05-15  DATE OF ADMISSION:  03/05/2011 DATE OF DISCHARGE:  03/10/2011                              DISCHARGE SUMMARY   FINAL DISCHARGE DIAGNOSES: 1. Healthcare-associated pneumonia versus atelectasis. 2. Pulmonary edema, improved. 3. Recent left shoulder surgery. 4. Hypertension, well controlled. 5. Diabetes mellitus type 2, controlled. 6. Morbid obesity.  SECONDARY DISCHARGE DIAGNOSES: 1. Hyperlipidemia. 2. History of frozen shoulder, status post surgery recently.  RADIOLOGY/IMAGING STUDIES: 1. Chest x-ray two views on August 8 showed markedly low lung volumes     with vascular congestion and vascular crowding.  Patchy airspace     disease in the left peripheral mid lung suspicious for pneumonia.     Followup imaging is recommended to ensure resolution. 2. CT head on March 05, 2011, showed no evidence of brain mass, brain     hemorrhage, or acute infarction.  No acute or active processes     seen.  No skull lesion is evident.  No sinusitis evident. 3. Repeat chest x-ray on August 12 showed stable patchy left mid lung     opacity, possibly aspiration/pneumonia and suspected interstitial     edema.  BRIEF ADMITTING HISTORY:  Please refer to H and P for more details.  On summary, Ms. Crystal Andrews is a 55 year old pleasant woman who recent left shoulder surgery about 2 days prior to admission, presented to the ER on March 05, 2011 with chief complaint of nausea, shortness of breath, and altered mental status.  HOSPITAL COURSE:  The patient's workup in the ED showed pneumonia in the chest x-ray.  The patient was admitted with diagnosis of healthcare- associated pneumonia. 1. For healthcare-associated pneumonia, the patient was placed on     vancomycin and Zosyn and  Levaquin and she was initially observed in     the step-down unit, stabilized, and transferred to the floor.  Her     antibiotic was tailored to Levaquin.  That was changed to p.o. and     will discharge on Levaquin to complete 7 days of therapy. 2. Pulmonary edema.  The patient was found to have elevated BMP, pro-     BMP of 419.  She also had lower extremity edema and shortness of     breath.  Her chest x-ray showed interstitial edema.  The patient     was started on IV Lasix with significant improvement in her     respiratory status and oxygen requirement.  A 2-D echocardiogram     was ordered which was done today.  However, we do not have the     report, it is still pending at the time of dictation.  The patient     will be discharged on Lasix and pending echo results.  If there is     any abnormalities that required cardiology evaluation that will be     addressed. 3. Hypertension, well controlled.  Her antihypertensive medication was     withheld during this  hospitalization.  Her blood pressure remained     on borderline low side.  Most recent blood pressure today is     106/66, off antihypertensive medicine, I will hold her     antihypertensive medication at home.  However patient advised      to check her blood pressure daily and if her blood pressure started     to elevate, to continue her outpatient antihypertensive medication,     and follow with her PCP for further adjustment of her medicine. 4. Diabetes mellitus type 2, controlled.  Continue metformin as per     outpatient dose and follow with PCP.  Her hemoglobin A1c was 6.7. 5. Recent left shoulder surgery.  The patient continue to have wound     dressing during this hospitalization as per Orthopedics orders and     her stitches were removed.  The patient to follow with Dr. Eulah Pont     as an outpatient. 6. The patient was seen by PT/OT and outpatient PT/OT recommended that     will be arranged for by case manager prior to  discharge. 7. The patient was seen and examined by me today.  She denies any     complaint.  Lungs exam was clear and she is stable for discharge to     home today with arrangement for home PT/OT and follow with her PCP.  DISCHARGE MEDICATIONS: 1. Levaquin 500 mg p.o. daily for 2 more days to complete 7 days of     therapy. 2. Zofran 4 mg p.o. q.6 h. p.r.n. 3. KCL 10 mEq 1 tablet p.o. daily, to take along with Lasix for 3     days. 4. Lasix 20 mg p.o. daily. 5. Actos 30 mg p.o. daily. 6. Ibuprofen 200 mg p.o. daily as needed. 7. Lexapro 20 mg 1 tablet p.o. daily. 8. Metformin 500 mg p.o. daily. 9. Omega-3 one capsule p.o. daily. 10.Omeprazole 20 mg p.o. daily. 11.Percocet 5/325 mg 1 tablet p.o. q.6 h. p.r.n. 12.Phenergan 12.5 mg p.o. q.6 h. p.r.n. 13.Pravastatin 1 tablet p.o. daily 40 mg.  1. Discharge medications, the patient to continue above medications as     prescribed. 2. The patient to monitor her blood pressure, check it daily while her     antihypertensive medicine was on hold secondary to borderline     hypotension.  The patient to resume her blood pressure medicine if     her systolic blood pressure is more than 110 and follow with PCP     for further adjustment of her regimen. 3. The patient to continue her hypoglycemic agent, however, I will     recommend to discontinue Actos and possibly increasing metformin in     a state that will be deferred to her PCP to be done as an     outpatient given her current volume overload that will be     recommended.  The patient to see her PCP as soon as possible to     adjust her hypoglycemic agents. 4. The patient to follow with Dr. Eulah Pont from [facility] as     arranged previously. 5. PT/OT to be continued as an outpatient as recommended. 6. The patient to report any worsening of symptoms or any new symptoms     to PCP or come to the ED if needed.  CONDITION ON DISCHARGE:  Improved.  The patient is stable for  discharge to home today.  However, still awaiting echocardiogram report.  If normal, the  patient will be discharged, otherwise any significant abnormalities, we will be at rest accordingly.          ______________________________ Baltazar Najjar, MD     SA/MEDQ  D:  03/10/2011  T:  03/10/2011  Job:  [id number]  cc:   Loreta Ave, M.D.  Electronically Signed by Hannah Beat MD on 05/06/2011 11:53:10 AM

## 2011-05-07 ENCOUNTER — Ambulatory Visit: Payer: BC Managed Care – PPO | Admitting: Physical Therapy

## 2011-05-10 NOTE — H&P (Signed)
[name], [name] NO.:  [id number]  MEDICAL RECORD NO.:  [id number]  LOCATION:  [facility]                         FACILITY:  [facility]  PHYSICIAN:  Crystal Andrews, M.D.      DATE OF BIRTH:  Nov 30, 1955  DATE OF ADMISSION:  03/04/2011 DATE OF DISCHARGE:                             HISTORY & PHYSICAL   PRIMARY CARE PHYSICIAN:  She is unassigned to [place].  ORTHOPEDIC:  Loreta Ave, MD  PRESENTING COMPLAINTS:  Nausea, shortness of breath, and altered mental status.  HISTORY OF PRESENT ILLNESS:  The patient is a 55 year old female with history of diabetes, hypertension, and hyperlipidemia who recently had a left rotator cuff surgery 2 days ago.  She spent Monday to Tuesday night at the surgical center and went home.  She started feeling generally sick early this morning with some shortness of breath and cough but mainly nausea and vomiting.  No diarrhea.  No abdominal pain.  She denied any chest pain.  Denied any hematemesis.  No bright red Andrews per rectum.  She was taken to [facility] where she was evaluated.  At that time, she was found sick with fever and chills.  The patient has been taking her medications, otherwise.  She was confused and was initially not herself.  She was also found to be mildly hypoxic with what appears to be pneumonia.  Hence, she is being admitted for further management.  PAST MEDICAL HISTORY:  Significant for: 1. Diabetes. 2. Hypertension. 3. Hyperlipidemia. 4. Morbid obesity. 5. Frozen shoulder status post surgery 2 days ago. 6. Multiple falls recently which led to her shoulder problem.  She has     had that back in 2009 also.  PAST SURGICAL HISTORY:  History of tubal ligation, history of rotator cuff surgery.  ALLERGIES:  She has no known drug allergies.  CURRENT MEDICATIONS: 1. Metformin XR 500 mg daily. 2. Omega-3 fatty acids with fish oil tablet daily. 3. Lexapro 20 mg daily. 4. Omeprazole 20 mg daily. 5.  Ibuprofen 200 mg as needed. 6. Hyzaar 50/12.5 one tablet daily. 7. Oxycodone/acetaminophen 5/325 one tablet q.6 h. p.r.n. 8. Pravastatin 40 mg daily. 9. Phenergan 12.5 mg as needed. 10.Also Actos 30 mg daily. 11.Micardis HCT 80/25 one tablet daily.  SOCIAL HISTORY:  The patient lives in [place] alone.  She denied tobacco, alcohol or IV drug use.  She is pretty much active and sharp, otherwise.  FAMILY HISTORY:  Noncontributory.  REVIEW OF SYSTEMS:  All systems are reviewed are negative except per HPI.  PHYSICAL EXAMINATION:  VITAL SIGNS:  She is afebrile.  At this point, her temperature is 99.5 in the ED with Andrews pressure 128/61, pulse of 91, respiratory rate 18, and sats 96% on 4 liters.  GENERAL:  The patient is awake, alert, oriented, currently, she is in no acute distress. HEENT:  PERRLA.  EOMI.  No significant pallor.  No jaundice.  No rhinorrhea. NECK:  Supple.  No visible JVD.  No lymphadenopathy. RESPIRATORY:  She has decreased air entry bilaterally with some mild crackles. CARDIOVASCULAR SYSTEM:  She has S1-S2.  No murmur. ABDOMEN:  Soft, full, obese, nontender with positive  bowel sounds. EXTREMITIES:  No edema, cyanosis, clubbing.  Her left upper extremity is in a sling with her shoulder dressed from her recent surgery. SKIN:  No rashes or ulcers.  LABORATORY DATA:  White count is 12.9, hemoglobin 11.3 with platelet of 247.  Sodium is 138, potassium 3.8, chloride 102, CO2 25, glucose 117, BUN 21, creatinine 1.40, albumin 3.4, calcium 9.3.  The rest of the LFTs are within normal.  Her ABG showed a pH of 7.322, pCO2 51, pO2 66 with oxygen sats 90% on 2 liters.  Her D-dimer is 0.27.  Initial cardiac enzymes CK of 219 with an MB of 5.3, index of 2.5.  Urinalysis is negative.  Pro BNP is 1312.  Head CT without contrast is negative. Chest x-ray showed markedly low lung volumes with vascular congestion and vascular crowding.  There is patchy airspace disease in the  left peripheral midlung suspicious for pneumonia.  ASSESSMENT:  This is a 55 year old female with known history of diabetes presenting with what appears to be left lung pneumonia.  In the setting of recent surgery and hospitalization, this will be considered healthcare-associated pneumonia.  PLAN:  Therefore: 1. Healthcare-associated pneumonia.  We will admit the patient, start     her on vancomycin and Zosyn per Pharmacy.  I will also add Levaquin     consistent with criteria for healthcare-associated pneumonia.  Put     her on oxygen until the patient improves. 2. Diabetes.  I will put her on sliding scale insulin, NovoLog.  Hold     her metformin.  If needed, we will use some Lantus in between. 3. Hypertension.  I will continue with her home medications but avoid     a lot of diuretics at this stage. 4. Depression and anxiety.  We will continue with Lexapro per home     dose. 5. Hyperlipidemia.  I will continue with the pravastatin while she is     here. 6. GERD.  Continue with PPIs in the hospital.  Further treatment will     depend on how the patient responds to these measures.     Crystal Andrews, M.D.     Verlin Grills  D:  03/05/2011  T:  03/06/2011  Job:  [id number]  Electronically Signed by Crystal Andrews M.D. on 05/10/2011 02:53:02 PM

## 2012-02-03 ENCOUNTER — Other Ambulatory Visit: Payer: Self-pay | Admitting: Gynecology

## 2012-02-03 ENCOUNTER — Encounter (INDEPENDENT_AMBULATORY_CARE_PROVIDER_SITE_OTHER): Payer: BC Managed Care – PPO

## 2012-02-03 DIAGNOSIS — M8580 Other specified disorders of bone density and structure, unspecified site: Secondary | ICD-10-CM | POA: Insufficient documentation

## 2012-02-03 DIAGNOSIS — Z13820 Encounter for screening for osteoporosis: Secondary | ICD-10-CM

## 2012-02-03 DIAGNOSIS — M899 Disorder of bone, unspecified: Secondary | ICD-10-CM

## 2012-02-05 ENCOUNTER — Encounter: Payer: Self-pay | Admitting: Gynecology

## 2012-02-11 ENCOUNTER — Encounter: Payer: Self-pay | Admitting: Gynecology

## 2012-02-11 ENCOUNTER — Ambulatory Visit (INDEPENDENT_AMBULATORY_CARE_PROVIDER_SITE_OTHER): Payer: BC Managed Care – PPO | Admitting: Gynecology

## 2012-02-11 VITALS — BP 130/84 | Ht 61.0 in | Wt 194.0 lb

## 2012-02-11 DIAGNOSIS — E785 Hyperlipidemia, unspecified: Secondary | ICD-10-CM | POA: Insufficient documentation

## 2012-02-11 DIAGNOSIS — D069 Carcinoma in situ of cervix, unspecified: Secondary | ICD-10-CM | POA: Insufficient documentation

## 2012-02-11 DIAGNOSIS — L293 Anogenital pruritus, unspecified: Secondary | ICD-10-CM

## 2012-02-11 DIAGNOSIS — K219 Gastro-esophageal reflux disease without esophagitis: Secondary | ICD-10-CM | POA: Insufficient documentation

## 2012-02-11 DIAGNOSIS — Z01419 Encounter for gynecological examination (general) (routine) without abnormal findings: Secondary | ICD-10-CM

## 2012-02-11 DIAGNOSIS — I10 Essential (primary) hypertension: Secondary | ICD-10-CM | POA: Insufficient documentation

## 2012-02-11 DIAGNOSIS — O009 Unspecified ectopic pregnancy without intrauterine pregnancy: Secondary | ICD-10-CM | POA: Insufficient documentation

## 2012-02-11 DIAGNOSIS — N952 Postmenopausal atrophic vaginitis: Secondary | ICD-10-CM

## 2012-02-11 MED ORDER — FLUCONAZOLE 150 MG PO TABS: 150.0000 mg | ORAL_TABLET | Freq: Once | ORAL | Status: AC

## 2012-02-11 MED ORDER — NYSTATIN-TRIAMCINOLONE 100000-0.1 UNIT/GM-% EX OINT: TOPICAL_OINTMENT | Freq: Two times a day (BID) | CUTANEOUS | Status: AC

## 2012-02-11 NOTE — Patient Instructions (Signed)
Follow up in one year for annual gynecologic exam. 

## 2012-02-11 NOTE — Progress Notes (Signed)
Crystal Andrews 08-26-1955 [id number]        56 y.o.  G3P2011 for annual exam.  Several issues noted below.  Past medical history,surgical history, medications, allergies, family history and social history were all reviewed and documented in the EPIC chart. ROS:  Was performed and pertinent positives and negatives are included in the history.  Exam: Kim assistant Filed Vitals:   02/11/12 1158  BP: 130/84  Height: 5\' 1"  (1.549 m)  Weight: 194 lb (87.998 kg)   General appearance  Normal Skin grossly normal Head/Neck normal with no cervical or supraclavicular adenopathy thyroid normal Lungs  clear Cardiac RR, without RMG Abdominal  soft, nontender, without masses, organomegaly or hernia Breasts  examined lying and sitting without masses, retractions, discharge or axillary adenopathy. Pelvic  Ext/BUS/vagina  normal with mild atrophic changes  Cervix  normal high in the vault  Uterus  Axial to anteverted, normal size, shape and contour, midline and mobile nontender   Adnexa  Without masses or tenderness    Anus and perineum  normal   Rectovaginal  normal sphincter tone without palpated masses or tenderness.    Assessment/Plan:  56 y.o. G40P2011 female for annual exam.   1. Postmenopausal. Is having hot flashes for which she takes [place] for. She'll continue on this at her choice. I reviewed options to include HRT in the risks benefits to include stroke heart attack DVT breast cancer risks. She is on Lexapro and does have a history of hypertension hypercholesterolemia. She's comfortable with the Estrovin and will continue on this.  No bleeding. 2. Osteopenia. DEXA 01/2012 T score -1.4. FRAX 12%/0.4%.  Increase calcium vitamin D reviewed. Repeat DEXA in 2 years. 3. Pap smear. History of cone biopsy 1989. Annual Pap smears since have all been normal. Last Pap smear 2012. No Pap smear was done today. We'll plan every 3-5 your Pap smears. 4. Colonoscopy. Patient had colonoscopy 5 years ago with  planned repeat at 10 year interval. Stool guaiac kit given patient is to call make sure we've received it and it is negative. 5. History of recurrent yeast infections. Patient is asymptomatic at this point but does use Diflucan intermittently and Mytrex cream. I refilled Diflucan 150 mg #4 one by mouth when necessary yeast and Mytrex cream 1 tube to be used when necessary. 6. Health maintenance. No blood work was done today this is all done through Dr. Charlott Rakes office who she sees routinely for her medical issues.   [name] MD, 12:53 PM 02/11/2012

## 2012-02-17 ENCOUNTER — Encounter: Payer: Self-pay | Admitting: Gynecology

## 2013-02-16 ENCOUNTER — Encounter: Payer: Self-pay | Admitting: Gynecology

## 2013-02-16 ENCOUNTER — Ambulatory Visit (INDEPENDENT_AMBULATORY_CARE_PROVIDER_SITE_OTHER): Payer: BC Managed Care – PPO | Admitting: Gynecology

## 2013-02-16 VITALS — BP 140/80 | Ht 61.0 in | Wt 186.0 lb

## 2013-02-16 DIAGNOSIS — Z01419 Encounter for gynecological examination (general) (routine) without abnormal findings: Secondary | ICD-10-CM

## 2013-02-16 DIAGNOSIS — N951 Menopausal and female climacteric states: Secondary | ICD-10-CM

## 2013-02-16 DIAGNOSIS — B373 Candidiasis of vulva and vagina: Secondary | ICD-10-CM

## 2013-02-16 MED ORDER — FLUCONAZOLE 150 MG PO TABS: 150.0000 mg | ORAL_TABLET | Freq: Once | ORAL | Status: DC

## 2013-02-16 NOTE — Patient Instructions (Signed)
Follow up in one year, sooner as needed. 

## 2013-02-16 NOTE — Progress Notes (Signed)
Crystal Andrews [birth year] [id number]        57 y.o.  G3P2011 for annual exam.  Doing well.  Past medical history,surgical history, medications, allergies, family history and social history were all reviewed and documented in the EPIC chart.  ROS:  Performed and pertinent positives and negatives are included in the history, assessment and plan .  Exam: Kim assistant Filed Vitals:   02/16/13 0915  BP: 140/80  Height: 5\' 1"  (1.549 m)  Weight: 186 lb (84.369 kg)   General appearance  Normal Skin grossly normal Head/Neck normal with no cervical or supraclavicular adenopathy thyroid normal Lungs  clear Cardiac RR, without RMG Abdominal  soft, nontender, without masses, organomegaly or hernia Breasts  examined lying and sitting without masses, retractions, discharge or axillary adenopathy. Pelvic  Ext/BUS/vagina  normal with mild atrophic changes  Cervix  scarred with mild atrophic changes  Uterus  anteverted, normal size, shape and contour, midline and mobile nontender   Adnexa  Without masses or tenderness    Anus and perineum  normal   Rectovaginal  normal sphincter tone without palpated masses or tenderness.    Assessment/Plan:  57 y.o. G86P2011 female for annual exam.   1. Postmenopausal. Patient is having some hot flashes and is using OTC soy. Reviewed options to include HRT. Patient's not interested but prefers to just a monitor at present. Not having significant vaginal dryness/irritation/dyspareunia. Patient knows to report any bleeding at all. 2. Occasional dysuria. No frequency, urgency, low back pain or other symptoms. Occurs sporadically. We'll check baseline UA today. UTI signs and symptoms reviewed 3. Pap smear 2012. No Pap smear done today. 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 Normal Pap smears since. Plan repeat Pap smear next year a 3 year interval. 4. Mammography today. Continue with annual mammography. SBE monthly reviewed. 5. Colonoscopy 7 years ago with planned repeat  at 10 year interval. 6. Health maintenance. No lab work done today as it is all done through her primary physician's office. Followup one year, sooner as needed.  Note: This document was prepared with digital dictation and possible smart phrase technology. Any transcriptional errors that result from this process are unintentional.   [name] MD, 10:39 AM 02/16/2013

## 2013-02-17 LAB — URINALYSIS W MICROSCOPIC + REFLEX CULTURE
Bilirubin Urine: NEGATIVE
Casts: NONE SEEN
Crystals: NONE SEEN
Glucose, UA: NEGATIVE mg/dL
Specific Gravity, Urine: 1.015 (ref 1.005–1.030)
pH: 5.5 (ref 5.0–8.0)

## 2013-02-18 ENCOUNTER — Encounter: Payer: Self-pay | Admitting: Gynecology

## 2013-02-19 LAB — URINE CULTURE

## 2013-02-21 ENCOUNTER — Other Ambulatory Visit: Payer: Self-pay | Admitting: Gynecology

## 2013-02-21 MED ORDER — SULFAMETHOXAZOLE-TMP DS 800-160 MG PO TABS: 1.0000 | ORAL_TABLET | Freq: Two times a day (BID) | ORAL | Status: DC

## 2013-07-04 ENCOUNTER — Ambulatory Visit: Payer: BC Managed Care – PPO | Admitting: Cardiovascular Disease

## 2013-07-08 ENCOUNTER — Ambulatory Visit: Payer: BC Managed Care – PPO | Admitting: Cardiovascular Disease

## 2013-07-15 ENCOUNTER — Encounter: Payer: Self-pay | Admitting: Cardiovascular Disease

## 2013-07-15 ENCOUNTER — Ambulatory Visit (INDEPENDENT_AMBULATORY_CARE_PROVIDER_SITE_OTHER): Payer: BC Managed Care – PPO | Admitting: Cardiovascular Disease

## 2013-07-15 VITALS — BP 180/100 | HR 88 | Ht 61.0 in | Wt 198.9 lb

## 2013-07-15 DIAGNOSIS — I10 Essential (primary) hypertension: Secondary | ICD-10-CM

## 2013-07-15 DIAGNOSIS — G4730 Sleep apnea, unspecified: Secondary | ICD-10-CM | POA: Insufficient documentation

## 2013-07-15 DIAGNOSIS — E785 Hyperlipidemia, unspecified: Secondary | ICD-10-CM

## 2013-07-15 NOTE — Patient Instructions (Signed)
Your physician wants you to follow-up in: 1 year with Dr Berry. You will receive a reminder letter in the mail two months in advance. If you don't receive a letter, please call our office to schedule the follow-up appointment.  

## 2013-07-15 NOTE — Assessment & Plan Note (Signed)
On statin therapy followed by her PCP 

## 2013-07-15 NOTE — Assessment & Plan Note (Signed)
On CPAP. ?

## 2013-07-15 NOTE — Assessment & Plan Note (Signed)
Well controlled as an outpatient though not well controlled today. The patient has not been wearing her CPAP this week.

## 2013-07-15 NOTE — Progress Notes (Signed)
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 I have been seeing her for diastolic dysfunction with risk factors that include diabetes, hypertension, and hyperlipidemia. She has normal LV systolic function with a negative Myoview. She has obstructive sleep apnea, on CPAP.Dr. Tenny Craw recently checked her lipid profile. Since I saw her one year ago she denies chest pain or shortness of breath.   Current Outpatient Prescriptions  Medication Sig Dispense Refill  . Cholecalciferol (VITAMIN D-3 PO) Take by mouth daily.      . Cinnamon 500 MG capsule Take 1,000 mg by mouth daily.      . Coenzyme Q10 (CO Q-10) 200 MG CAPS Take 1 capsule by mouth daily.      Marland Kitchen escitalopram (LEXAPRO) 20 MG tablet Take 20 mg by mouth every other day.       . ibuprofen (ADVIL,MOTRIN) 200 MG tablet Take 200 mg by mouth every 6 (six) hours as needed. Pain        . Krill Oil 300 MG CAPS Take 1 capsule by mouth daily.      . metFORMIN (GLUCOPHAGE-XR) 500 MG 24 hr tablet Take 500 mg by mouth 2 (two) times daily.        Marland Kitchen omeprazole (PRILOSEC) 20 MG capsule Take 20 mg by mouth 2 (two) times daily.       Marland Kitchen OVER THE COUNTER MEDICATION daily. Estroven      . rosuvastatin (CRESTOR) 10 MG tablet Take 10 mg by mouth daily.       No current facility-administered medications for this visit.    Allergies  Allergen Reactions  . Sulfa Antibiotics Nausea Only    History   Social History  . Marital Status: Married    Spouse Name: N/A    Number of Children: N/A  . Years of Education: N/A   Occupational History  . Not on file.   Social History Main Topics  . Smoking status: Never Smoker   . Smokeless tobacco: Never Used  . Alcohol Use: No    . Drug Use: No  . Sexual Activity: Yes    Birth Control/ Protection: Post-menopausal   Other Topics Concern  . Not on file   Social History Narrative  . No narrative on file     Review of Systems: General: negative for chills, fever, night sweats or weight changes.  Cardiovascular: negative for chest pain, dyspnea on exertion, edema, orthopnea, palpitations, paroxysmal nocturnal dyspnea or shortness of breath Dermatological: negative for rash Respiratory: negative for cough or wheezing Urologic: negative for hematuria Abdominal: negative for nausea, vomiting, diarrhea, bright red blood per rectum, melena, or hematemesis Neurologic: negative for visual changes, syncope, or dizziness All other systems reviewed and are otherwise negative except as noted above.    Blood pressure 180/100, pulse 88, height 5\' 1"  (1.549 m), weight 198 lb 14.4 oz (90.22 kg).  General appearance: alert and no distress Neck: no adenopathy, no carotid bruit, no JVD, supple, symmetrical, trachea midline and thyroid not enlarged, symmetric, no tenderness/mass/nodules Lungs: clear to auscultation bilaterally Heart: regular rate and rhythm, S1, S2 normal, no murmur, click, rub or gallop Extremities: extremities normal, atraumatic, no cyanosis or edema  EKG normal sinus rhythm at 84 without ST  or T wave changes  ASSESSMENT AND PLAN:   Sleep apnea On CPAP  Hypertension Well controlled as an outpatient though not well controlled today. The patient has not been wearing her CPAP this week.  Hyperlipemia On statin therapy followed by her PCP      Runell Gess MD [facility], [facility] 07/15/2013 4:07 PM

## 2013-07-18 ENCOUNTER — Encounter: Payer: Self-pay | Admitting: Cardiovascular Disease

## 2014-01-04 ENCOUNTER — Telehealth: Payer: Self-pay | Admitting: Cardiovascular Disease

## 2014-01-04 NOTE — Telephone Encounter (Signed)
Follow Up    Pt is following up in a new prescription that was sent over for a new CPAP machine. Please call.

## 2014-01-04 NOTE — Telephone Encounter (Signed)
cpap

## 2014-01-04 NOTE — Telephone Encounter (Signed)
Follow Up ° °Pt returning call from earlier. Please call. °

## 2014-01-05 NOTE — Telephone Encounter (Signed)
CPAP supply order faxed to [facility]

## 2014-01-05 NOTE — Telephone Encounter (Signed)
Returned a call to patient in reference to her CPAP machine. She informs me that she was getting some sort of "error" message. She called [facility] and was told that she needed a new prescription for her supplies. Informed her that normally the DME company will send a prescription for Dr. Tresa Endo to sign when it's time to renew. We have not received a prescription, however I will send over our standard order form.

## 2014-01-09 ENCOUNTER — Other Ambulatory Visit: Payer: Self-pay | Admitting: Gynecology

## 2014-01-09 DIAGNOSIS — M8580 Other specified disorders of bone density and structure, unspecified site: Secondary | ICD-10-CM

## 2014-01-11 ENCOUNTER — Other Ambulatory Visit: Payer: Self-pay | Admitting: Dermatology

## 2014-02-14 ENCOUNTER — Ambulatory Visit (INDEPENDENT_AMBULATORY_CARE_PROVIDER_SITE_OTHER): Payer: BC Managed Care – PPO

## 2014-02-14 DIAGNOSIS — M949 Disorder of cartilage, unspecified: Secondary | ICD-10-CM

## 2014-02-14 DIAGNOSIS — M8580 Other specified disorders of bone density and structure, unspecified site: Secondary | ICD-10-CM

## 2014-02-14 DIAGNOSIS — M899 Disorder of bone, unspecified: Secondary | ICD-10-CM

## 2014-02-15 ENCOUNTER — Telehealth: Payer: Self-pay | Admitting: Gynecology

## 2014-02-15 ENCOUNTER — Encounter: Payer: Self-pay | Admitting: Gynecology

## 2014-02-15 NOTE — Telephone Encounter (Signed)
Told patient that her bone density shows some mild loss from her prior study. Recommend checking a vitamin D level otherwise would observe at present and recheck study in 2 years.

## 2014-02-16 ENCOUNTER — Other Ambulatory Visit: Payer: Self-pay | Admitting: *Deleted

## 2014-02-16 DIAGNOSIS — M898X9 Other specified disorders of bone, unspecified site: Secondary | ICD-10-CM

## 2014-02-16 DIAGNOSIS — M8580 Other specified disorders of bone density and structure, unspecified site: Secondary | ICD-10-CM

## 2014-02-16 NOTE — Telephone Encounter (Signed)
PT INFORMED WITH THE BELOW NOTE, ORDER PLACED PT HAS ANNUAL TOMORROW AND WILL HAVE LEVEL CHECKED THEN

## 2014-02-17 ENCOUNTER — Other Ambulatory Visit (HOSPITAL_COMMUNITY)
Admission: RE | Admit: 2014-02-17 | Discharge: 2014-02-17 | Disposition: A | Discharge status: Home / Self Care | Payer: BC Managed Care – PPO | Source: Ambulatory Visit | Attending: Gynecology | Admitting: Gynecology

## 2014-02-17 ENCOUNTER — Encounter: Payer: Self-pay | Admitting: Gynecology

## 2014-02-17 ENCOUNTER — Ambulatory Visit (INDEPENDENT_AMBULATORY_CARE_PROVIDER_SITE_OTHER): Payer: BC Managed Care – PPO | Admitting: Gynecology

## 2014-02-17 VITALS — BP 120/80 | Ht 61.0 in | Wt 189.6 lb

## 2014-02-17 DIAGNOSIS — Z1151 Encounter for screening for human papillomavirus (HPV): Secondary | ICD-10-CM | POA: Insufficient documentation

## 2014-02-17 DIAGNOSIS — M899 Disorder of bone, unspecified: Secondary | ICD-10-CM

## 2014-02-17 DIAGNOSIS — M8580 Other specified disorders of bone density and structure, unspecified site: Secondary | ICD-10-CM

## 2014-02-17 DIAGNOSIS — Z01419 Encounter for gynecological examination (general) (routine) without abnormal findings: Secondary | ICD-10-CM

## 2014-02-17 DIAGNOSIS — M949 Disorder of cartilage, unspecified: Secondary | ICD-10-CM

## 2014-02-17 LAB — URINALYSIS W MICROSCOPIC + REFLEX CULTURE
BILIRUBIN URINE: NEGATIVE
Bacteria, UA: NONE SEEN
CASTS: NONE SEEN
GLUCOSE, UA: NEGATIVE mg/dL
Hgb urine dipstick: NEGATIVE
Ketones, ur: NEGATIVE mg/dL
Leukocytes, UA: NEGATIVE
Nitrite: NEGATIVE
Protein, ur: NEGATIVE mg/dL
SPECIFIC GRAVITY, URINE: 1.027 (ref 1.005–1.030)
Urobilinogen, UA: 0.2 mg/dL (ref 0.0–1.0)
pH: 5.5 (ref 5.0–8.0)

## 2014-02-17 MED ORDER — FLUCONAZOLE 150 MG PO TABS: 150.0000 mg | ORAL_TABLET | Freq: Once | ORAL | Status: DC

## 2014-02-17 NOTE — Progress Notes (Signed)
Crystal SalvageCindy Andrews 1955/12/03 [id number]        58 y.o.  G3P2011 for annual exam.  Several issues noted below.  Past medical history,surgical history, problem list, medications, allergies, family history and social history were all reviewed and documented as reviewed in the EPIC chart.  ROS:  12 system ROS performed with pertinent positives and negatives included in the history, assessment and plan.   Additional significant findings :  None   Exam: Air cabin crewKari assistant Filed Vitals:   02/17/14 0856  BP: 120/80  Height: 5\' 1"  (1.549 m)  Weight: 189 lb 9.6 oz (86.002 kg)   General appearance:  Normal affect, orientation and appearance. Skin: Grossly normal HEENT: Without gross lesions.  No cervical or supraclavicular adenopathy. Thyroid normal.  Lungs:  Clear without wheezing, rales or rhonchi Cardiac: RR, without RMG Abdominal:  Soft, nontender, without masses, guarding, rebound, organomegaly or hernia Breasts:  Examined lying and sitting without masses, retractions, discharge or axillary adenopathy. Pelvic:  Ext/BUS/vagina with generalized atrophic changes  Cervix scarring from her cone biopsy. Pap/HPV done  Uterus grossly normal size midline mobile nontender  Adnexa  Without gross masses or tenderness    Anus and perineum  Normal   Rectovaginal  Normal sphincter tone without palpated masses or tenderness.    Assessment/Plan:  58 y.o. 713P2011 female for annual exam.   1. Postmenopausal/atrophic genital changes. Without significant symptoms of hot flushes, night sweats, vaginal dryness. Is not sexually active. No bleeding. Continue to monitor. Report any vaginal bleeding. 2. Osteopenia. DEXA 2015 T score -2.2 FRAX 17%/1.3%. Check vitamin D level today. Calcium/vitamin D recommendations reviewed. Repeat DEXA at two-year interval. 3. Pap smear 2012. Pap/HPV done today. 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 Normal Pap smears afterwards. Continue to monitor in 3-5 year  intervals assuming this Pap smear is normal. 4. Mammography scheduled today. Continue with annual mammography. SBE monthly reviewed. 5. Colonoscopy 8 years ago with plan to repeat at age 58. 606. Health maintenance. No routine blood work done and she has this done through her primary physician's office. Followup one year, sooner as needed   Note: This document was prepared with digital dictation and possible smart phrase technology. Any transcriptional errors that result from this process are unintentional.   Dara LordsFONTAINE,Yeraldi Fidler P MD, 9:15 AM 02/17/2014

## 2014-02-17 NOTE — Addendum Note (Signed)
Addended by: Richardson ChiquitoWILKINSON, Undra Trembath S on: 02/17/2014 11:12 AM   Modules accepted: Orders

## 2014-02-17 NOTE — Patient Instructions (Signed)
Followup in one year for annual exam, sooner as needed.  You may obtain a copy of any labs that were done today by logging onto MyChart as outlined in the instructions provided with your AVS (after visit summary). The office will not call with normal lab results but certainly if there are any significant abnormalities then we will contact you.   Health Maintenance, Female A healthy lifestyle and preventative care can promote health and wellness.  Maintain regular health, dental, and eye exams.  Eat a healthy diet. Foods like vegetables, fruits, whole grains, low-fat dairy products, and lean protein foods contain the nutrients you need without too many calories. Decrease your intake of foods high in solid fats, added sugars, and salt. Get information about a proper diet from your caregiver, if necessary.  Regular physical exercise is one of the most important things you can do for your health. Most adults should get at least 150 minutes of moderate-intensity exercise (any activity that increases your heart rate and causes you to sweat) each week. In addition, most adults need muscle-strengthening exercises on 2 or more days a week.   Maintain a healthy weight. The body mass index (BMI) is a screening tool to identify possible weight problems. It provides an estimate of body fat based on height and weight. Your caregiver can help determine your BMI, and can help you achieve or maintain a healthy weight. For adults 20 years and older:  A BMI below 18.5 is considered underweight.  A BMI of 18.5 to 24.9 is normal.  A BMI of 25 to 29.9 is considered overweight.  A BMI of 30 and above is considered obese.  Maintain normal blood lipids and cholesterol by exercising and minimizing your intake of saturated fat. Eat a balanced diet with plenty of fruits and vegetables. Blood tests for lipids and cholesterol should begin at age 20 and be repeated every 5 years. If your lipid or cholesterol levels are  high, you are over 50, or you are a high risk for heart disease, you may need your cholesterol levels checked more frequently.Ongoing high lipid and cholesterol levels should be treated with medicines if diet and exercise are not effective.  If you smoke, find out from your caregiver how to quit. If you do not use tobacco, do not start.  Lung cancer screening is recommended for adults aged 55 80 years who are at high risk for developing lung cancer because of a history of smoking. Yearly low-dose computed tomography (CT) is recommended for people who have at least a 30-pack-year history of smoking and are a current smoker or have quit within the past 15 years. A pack year of smoking is smoking an average of 1 pack of cigarettes a day for 1 year (for example: 1 pack a day for 30 years or 2 packs a day for 15 years). Yearly screening should continue until the smoker has stopped smoking for at least 15 years. Yearly screening should also be stopped for people who develop a health problem that would prevent them from having lung cancer treatment.  If you are pregnant, do not drink alcohol. If you are breastfeeding, be very cautious about drinking alcohol. If you are not pregnant and choose to drink alcohol, do not exceed 1 drink per day. One drink is considered to be 12 ounces (355 mL) of beer, 5 ounces (148 mL) of wine, or 1.5 ounces (44 mL) of liquor.  Avoid use of street drugs. Do not share needles with anyone.   anyone. Ask for help if you need support or instructions about stopping the use of drugs.  High blood pressure causes heart disease and increases the risk of stroke. Blood pressure should be checked at least every 1 to 2 years. Ongoing high blood pressure should be treated with medicines, if weight loss and exercise are not effective.  If you are 55 to 58 years old, ask your caregiver if you should take aspirin to prevent strokes.  Diabetes screening involves taking a blood sample to check your fasting  blood sugar level. This should be done once every 3 years, after age 45, if you are within normal weight and without risk factors for diabetes. Testing should be considered at a younger age or be carried out more frequently if you are overweight and have at least 1 risk factor for diabetes.  Breast cancer screening is essential preventative care for women. You should practice "breast self-awareness." This means understanding the normal appearance and feel of your breasts and may include breast self-examination. Any changes detected, no matter how small, should be reported to a caregiver. Women in their 20s and 30s should have a clinical breast exam (CBE) by a caregiver as part of a regular health exam every 1 to 3 years. After age 40, women should have a CBE every year. Starting at age 40, women should consider having a mammogram (breast X-ray) every year. Women who have a family history of breast cancer should talk to their caregiver about genetic screening. Women at a high risk of breast cancer should talk to their caregiver about having an MRI and a mammogram every year.  Breast cancer gene (BRCA)-related cancer risk assessment is recommended for women who have family members with BRCA-related cancers. BRCA-related cancers include breast, ovarian, tubal, and peritoneal cancers. Having family members with these cancers may be associated with an increased risk for harmful changes (mutations) in the breast cancer genes BRCA1 and BRCA2. Results of the assessment will determine the need for genetic counseling and BRCA1 and BRCA2 testing.  The Pap test is a screening test for cervical cancer. Women should have a Pap test starting at age 21. Between ages 21 and 29, Pap tests should be repeated every 2 years. Beginning at age 30, you should have a Pap test every 3 years as long as the past 3 Pap tests have been normal. If you had a hysterectomy for a problem that was not cancer or a condition that could lead to  cancer, then you no longer need Pap tests. If you are between ages 65 and 70, and you have had normal Pap tests going back 10 years, you no longer need Pap tests. If you have had past treatment for cervical cancer or a condition that could lead to cancer, you need Pap tests and screening for cancer for at least 20 years after your treatment. If Pap tests have been discontinued, risk factors (such as a new sexual partner) need to be reassessed to determine if screening should be resumed. Some women have medical problems that increase the chance of getting cervical cancer. In these cases, your caregiver may recommend more frequent screening and Pap tests.  The human papillomavirus (HPV) test is an additional test that may be used for cervical cancer screening. The HPV test looks for the virus that can cause the cell changes on the cervix. The cells collected during the Pap test can be tested for HPV. The HPV test could be used to screen women aged 30   years and older, and should be used in women of any age who have unclear Pap test results. After the age of 30, women should have HPV testing at the same frequency as a Pap test.  Colorectal cancer can be detected and often prevented. Most routine colorectal cancer screening begins at the age of 50 and continues through age 75. However, your caregiver may recommend screening at an earlier age if you have risk factors for colon cancer. On a yearly basis, your caregiver may provide home test kits to check for hidden blood in the stool. Use of a small camera at the end of a tube, to directly examine the colon (sigmoidoscopy or colonoscopy), can detect the earliest forms of colorectal cancer. Talk to your caregiver about this at age 50, when routine screening begins. Direct examination of the colon should be repeated every 5 to 10 years through age 75, unless early forms of pre-cancerous polyps or small growths are found.  Hepatitis C blood testing is recommended for  all people born from 1945 through 1965 and any individual with known risks for hepatitis C.  Practice safe sex. Use condoms and avoid high-risk sexual practices to reduce the spread of sexually transmitted infections (STIs). Sexually active women aged 25 and younger should be checked for Chlamydia, which is a common sexually transmitted infection. Older women with new or multiple partners should also be tested for Chlamydia. Testing for other STIs is recommended if you are sexually active and at increased risk.  Osteoporosis is a disease in which the bones lose minerals and strength with aging. This can result in serious bone fractures. The risk of osteoporosis can be identified using a bone density scan. Women ages 65 and over and women at risk for fractures or osteoporosis should discuss screening with their caregivers. Ask your caregiver whether you should be taking a calcium supplement or vitamin D to reduce the rate of osteoporosis.  Menopause can be associated with physical symptoms and risks. Hormone replacement therapy is available to decrease symptoms and risks. You should talk to your caregiver about whether hormone replacement therapy is right for you.  Use sunscreen. Apply sunscreen liberally and repeatedly throughout the day. You should seek shade when your shadow is shorter than you. Protect yourself by wearing long sleeves, pants, a wide-brimmed hat, and sunglasses year round, whenever you are outdoors.  Notify your caregiver of new moles or changes in moles, especially if there is a change in shape or color. Also notify your caregiver if a mole is larger than the size of a pencil eraser.  Stay current with your immunizations. Document Released: 01/27/2011 Document Revised: 11/08/2012 Document Reviewed: 01/27/2011 Exit[employer] Patient Information 2014 [employer].   

## 2014-02-18 LAB — VITAMIN D 25 HYDROXY (VIT D DEFICIENCY, FRACTURES): Vit D, 25-Hydroxy: 68 ng/mL (ref 30–89)

## 2014-02-21 ENCOUNTER — Encounter: Payer: Self-pay | Admitting: Gynecology

## 2014-02-21 LAB — CYTOLOGY - PAP

## 2014-05-29 ENCOUNTER — Encounter: Payer: Self-pay | Admitting: Gynecology

## 2014-07-11 ENCOUNTER — Ambulatory Visit: Payer: BC Managed Care – PPO | Admitting: Cardiovascular Disease

## 2014-11-13 ENCOUNTER — Telehealth: Payer: Self-pay | Admitting: Cardiovascular Disease

## 2014-11-14 NOTE — Telephone Encounter (Signed)
Closed encounter °

## 2014-12-21 ENCOUNTER — Encounter: Payer: Self-pay | Admitting: *Deleted

## 2014-12-29 ENCOUNTER — Encounter: Payer: Self-pay | Admitting: Cardiovascular Disease

## 2014-12-29 ENCOUNTER — Ambulatory Visit (INDEPENDENT_AMBULATORY_CARE_PROVIDER_SITE_OTHER): Payer: Self-pay | Admitting: Cardiovascular Disease

## 2014-12-29 VITALS — BP 114/88 | HR 83 | Ht 61.0 in | Wt 189.7 lb

## 2014-12-29 DIAGNOSIS — E785 Hyperlipidemia, unspecified: Secondary | ICD-10-CM | POA: Diagnosis not present

## 2014-12-29 DIAGNOSIS — I10 Essential (primary) hypertension: Secondary | ICD-10-CM | POA: Diagnosis not present

## 2014-12-29 DIAGNOSIS — G4730 Sleep apnea, unspecified: Secondary | ICD-10-CM

## 2014-12-29 NOTE — Progress Notes (Signed)
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 I have been seeing her for diastolic dysfunction with risk factors that include diabetes, hypertension, and hyperlipidemia. She has normal LV systolic function with a negative Myoview. She has obstructive sleep apnea, on CPAP.Dr. Tenny Craw recently checked her lipid profile. Since I saw her 1-1/2 years ago she denies chest pain or shortness of breath. She was taken off of Crestor because of myalgias and had a lipid profile performed 07/13/14 revealed a total cholesterol 223, LDL of 145 and HDL of 47. She was placed back on pravastatin.   Current Outpatient Prescriptions  Medication Sig Dispense Refill  . ALPRAZolam (XANAX) 0.5 MG tablet Take 0.5 mg by mouth at bedtime as needed for anxiety.    Marland Kitchen aspirin 81 MG tablet Take 81 mg by mouth daily.    . Cholecalciferol (VITAMIN D-3 PO) Take by mouth daily.    . Cinnamon 500 MG capsule Take 1,000 mg by mouth daily.    . Coenzyme Q10 (CO Q-10) 200 MG CAPS Take 1 capsule by mouth daily.    Marland Kitchen escitalopram (LEXAPRO) 20 MG tablet Take 20 mg by mouth every other day.     . fluconazole (DIFLUCAN) 150 MG tablet Take 1 tablet (150 mg total) by mouth once. 1 tablet 4  . ibuprofen (ADVIL,MOTRIN) 200 MG tablet Take 200 mg by mouth every 6 (six) hours as needed. Pain      . irbesartan (AVAPRO) 75 MG tablet Take 75 mg by mouth daily.    Boris Lown Oil 300 MG CAPS Take 1 capsule by mouth daily.    . metFORMIN (GLUCOPHAGE-XR) 500 MG 24 hr tablet Take 500 mg by mouth 2 (two) times daily.      Marland Kitchen omeprazole (PRILOSEC) 20 MG capsule Take 20 mg by mouth 2 (two) times daily.     Marland Kitchen OVER THE COUNTER MEDICATION daily. Estroven    . pravastatin  (PRAVACHOL) 40 MG tablet Take 40 mg by mouth daily.  2  . promethazine (PHENERGAN) 25 MG tablet Take 25 mg by mouth every 6 (six) hours as needed for nausea or vomiting.     No current facility-administered medications for this visit.    Allergies  Allergen Reactions  . Sulfa Antibiotics Nausea Only    History   Social History  . Marital Status: Married    Spouse Name: N/A  . Number of Children: N/A  . Years of Education: N/A   Occupational History  . Not on file.   Social History Main Topics  . Smoking status: Never Smoker   . Smokeless tobacco: Never Used  . Alcohol Use: No  . Drug Use: No  . Sexual Activity: No   Other Topics Concern  . Not on file   Social History Narrative     Review of Systems: General: negative for chills, fever, night sweats or weight changes.  Cardiovascular: negative for chest pain, dyspnea on exertion, edema, orthopnea, palpitations, paroxysmal nocturnal dyspnea or shortness of breath Dermatological: negative for rash Respiratory: negative for cough or wheezing Urologic: negative for hematuria Abdominal: negative for nausea, vomiting, diarrhea, bright red blood per rectum, melena, or hematemesis Neurologic: negative for visual changes, syncope, or dizziness All other systems reviewed  and are otherwise negative except as noted above.    Blood pressure 114/88, pulse 83, height 5\' 1"  (1.549 m), weight 189 lb 11.2 oz (86.047 kg).  General appearance: alert and no distress Neck: no adenopathy, no carotid bruit, no JVD, supple, symmetrical, trachea midline and thyroid not enlarged, symmetric, no tenderness/mass/nodules Lungs: clear to auscultation bilaterally Heart: regular rate and rhythm, S1, S2 normal, no murmur, click, rub or gallop Extremities: extremities normal, atraumatic, no cyanosis or edema  EKG normal sinus rhythm 83 without ST or T-wave changes. I personally reviewed this EKG  ASSESSMENT AND PLAN:   Sleep apnea History of  obstructive sleep apnea on C Pap   Hypertension History of hypertension blood pressure measured to 114/88. She is on Avapro. Continued current meds at current dosing   Hyperlipemia History of hyperlipidemia previously on Crestor which she was intolerant to by mouth recent lipid profile performed 07/13/14 revealed total consult to 23, LDL 145 and HDL 47. She was placed back on pravastatin. Primary care physician is following her lipid profile       Runell GessJonathan J. Cherry Turlington MD [facility] LLCFACP,FACC,FAHA, [facility] CenterFSCAI 12/29/2014 5:08 PM

## 2014-12-29 NOTE — Patient Instructions (Signed)
Your physician wants you to follow-up in: 1 year with Dr Allyson SabalBerry.  You will receive a reminder letter in the mail two months in advance. If you don't receive a letter, please call our office to schedule the follow-up appointment.  Dr Claud KelpHaywood Ingram  [phone]

## 2014-12-29 NOTE — Assessment & Plan Note (Signed)
History of hypertension blood pressure measured to 114/88. She is on Avapro. Continued current meds at current dosing

## 2014-12-29 NOTE — Assessment & Plan Note (Signed)
History of hyperlipidemia previously on Crestor which she was intolerant to by mouth recent lipid profile performed 07/13/14 revealed total consult to 23, LDL 145 and HDL 47. She was placed back on pravastatin. Primary care physician is following her lipid profile

## 2014-12-29 NOTE — Assessment & Plan Note (Addendum)
History of obstructive sleep apnea on C Pap 

## 2015-02-22 ENCOUNTER — Encounter: Payer: Self-pay | Admitting: Gynecology

## 2015-02-22 ENCOUNTER — Ambulatory Visit (INDEPENDENT_AMBULATORY_CARE_PROVIDER_SITE_OTHER): Payer: BLUE CROSS/BLUE SHIELD | Admitting: Gynecology

## 2015-02-22 VITALS — BP 116/74 | Ht 61.0 in | Wt 189.0 lb

## 2015-02-22 DIAGNOSIS — Z01419 Encounter for gynecological examination (general) (routine) without abnormal findings: Secondary | ICD-10-CM

## 2015-02-22 DIAGNOSIS — M8580 Other specified disorders of bone density and structure, unspecified site: Secondary | ICD-10-CM

## 2015-02-22 DIAGNOSIS — N898 Other specified noninflammatory disorders of vagina: Secondary | ICD-10-CM

## 2015-02-22 DIAGNOSIS — N952 Postmenopausal atrophic vaginitis: Secondary | ICD-10-CM | POA: Diagnosis not present

## 2015-02-22 LAB — WET PREP FOR TRICH, YEAST, CLUE
Clue Cells Wet Prep HPF POC: NONE SEEN
Trich, Wet Prep: NONE SEEN

## 2015-02-22 MED ORDER — FLUCONAZOLE 150 MG PO TABS: 150.0000 mg | ORAL_TABLET | Freq: Once | ORAL | Status: DC

## 2015-02-22 MED ORDER — NYSTATIN-TRIAMCINOLONE 100000-0.1 UNIT/GM-% EX OINT: 1.0000 "application " | TOPICAL_OINTMENT | Freq: Two times a day (BID) | CUTANEOUS | Status: DC

## 2015-02-22 NOTE — Progress Notes (Signed)
Crystal Andrews [birth year] [id number]        59 y.o.  G3P2011 for annual exam.  Several issues noted below.  Past medical history,surgical history, problem list, medications, allergies, family history and social history were all reviewed and documented as reviewed in the EPIC chart.  ROS:  Performed with pertinent positives and negatives included in the history, assessment and plan.   Additional significant findings :  Vaginal itching and discharge 5 days   Exam: Kim assistant Filed Vitals:   02/22/15 0931  BP: 116/74  Height:  (1.549 m)  Weight: 189 lb (85.73 kg)   General appearance:  Normal affect, orientation and appearance. Skin: Grossly normal HEENT: Without gross lesions.  No cervical or supraclavicular adenopathy. Thyroid normal.  Lungs:  Clear without wheezing, rales or rhonchi Cardiac: RR, without RMG Abdominal:  Soft, nontender, without masses, guarding, rebound, organomegaly or hernia Breasts:  Examined lying and sitting without masses, retractions, discharge or axillary adenopathy. Pelvic:  Ext/BUS/vagina with white discharge. Wet prep done  Cervix high in the vaginal vault status post cone scarring  Uterus difficult to palpate but no gross masses or tenderness  Adnexa  Without gross masses or tenderness    Anus and perineum  Normal   Rectovaginal  Normal sphincter tone without palpated masses or tenderness.    Assessment/Plan:  59 y.o. G96P2011 female for annual exam.   1. Vaginal discharge/itching.  Wet prep positive for yeast. Patient does have a history of recurrent yeast infections. Have used Mytrex/Diflucan in the past. Diflucan 150 mg #5 one by mouth when necessary yeast and Mytrex 30 g applied twice daily as needed. Follow up if symptoms persist worsen. 2. Postmenopausal/atrophic changes. Without significant flashes, night sweats, vaginal dryness and no vaginal bleeding. Continue to monitor and report any vaginal bleeding.  Patient did ask about ovarian  cancer screening as a friend of theirs is going through this now. Reviewed the options to include CA-125 and ultrasound screening. The pros and cons as well as false positive/false negative results discussed. She is obese and this limits the pelvic exam somewhat and I offered ultrasound screening. The patient declined and does not want to pursue this. 3. Osteopenia. DEXA 2015 T score -2.2 FRAX 17%/1.3%.  Increase calcium and vitamin D reviewed. Vitamin D level last year 70. 4. Pap smear/HPV negative 2015. No Pap smear done today. 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 Plan repeat in 3-5 year interval. 5. Colposcopy 2006. Patient due now and I reminded her to call and schedule and she agrees to do so. 6. Mammography today. Continue with annual mammography. SBE monthly reviewed. 7. Health maintenance. No routine lab work done as this is done at her primary physician's work office. Follow up in one year, sooner as needed.   [name] MD, 9:48 AM 02/22/2015

## 2015-02-22 NOTE — Patient Instructions (Signed)
Use the Diflucan and the Mytrex cream externally as needed. Follow up if the symptoms persist or worsen.  You may obtain a copy of any labs that were done today by logging onto MyChart as outlined in the instructions provided with your AVS (after visit summary). The office will not call with normal lab results but certainly if there are any significant abnormalities then we will contact you.   Health Maintenance, Female A healthy lifestyle and preventative care can promote health and wellness.  Maintain regular health, dental, and eye exams.  Eat a healthy diet. Foods like vegetables, fruits, whole grains, low-fat dairy products, and lean protein foods contain the nutrients you need without too many calories. Decrease your intake of foods high in solid fats, added sugars, and salt. Get information about a proper diet from your caregiver, if necessary.  Regular physical exercise is one of the most important things you can do for your health. Most adults should get at least 150 minutes of moderate-intensity exercise (any activity that increases your heart rate and causes you to sweat) each week. In addition, most adults need muscle-strengthening exercises on 2 or more days a week.   Maintain a healthy weight. The body mass index (BMI) is a screening tool to identify possible weight problems. It provides an estimate of body fat based on height and weight. Your caregiver can help determine your BMI, and can help you achieve or maintain a healthy weight. For adults 20 years and older:  A BMI below 18.5 is considered underweight.  A BMI of 18.5 to 24.9 is normal.  A BMI of 25 to 29.9 is considered overweight.  A BMI of 30 and above is considered obese.  Maintain normal blood lipids and cholesterol by exercising and minimizing your intake of saturated fat. Eat a balanced diet with plenty of fruits and vegetables. Blood tests for lipids and cholesterol should begin at age 20 and be repeated every 5  years. If your lipid or cholesterol levels are high, you are over 50, or you are a high risk for heart disease, you may need your cholesterol levels checked more frequently.Ongoing high lipid and cholesterol levels should be treated with medicines if diet and exercise are not effective.  If you smoke, find out from your caregiver how to quit. If you do not use tobacco, do not start.  Lung cancer screening is recommended for adults aged 55 80 years who are at high risk for developing lung cancer because of a history of smoking. Yearly low-dose computed tomography (CT) is recommended for people who have at least a 30-pack-year history of smoking and are a current smoker or have quit within the past 15 years. A pack year of smoking is smoking an average of 1 pack of cigarettes a day for 1 year (for example: 1 pack a day for 30 years or 2 packs a day for 15 years). Yearly screening should continue until the smoker has stopped smoking for at least 15 years. Yearly screening should also be stopped for people who develop a health problem that would prevent them from having lung cancer treatment.  If you are pregnant, do not drink alcohol. If you are breastfeeding, be very cautious about drinking alcohol. If you are not pregnant and choose to drink alcohol, do not exceed 1 drink per day. One drink is considered to be 12 ounces (355 mL) of beer, 5 ounces (148 mL) of wine, or 1.5 ounces (44 mL) of liquor.  Avoid use of   street drugs. Do not share needles with anyone. Ask for help if you need support or instructions about stopping the use of drugs.  High blood pressure causes heart disease and increases the risk of stroke. Blood pressure should be checked at least every 1 to 2 years. Ongoing high blood pressure should be treated with medicines, if weight loss and exercise are not effective.  If you are 33 to 59 years old, ask your caregiver if you should take aspirin to prevent strokes.  Diabetes screening  involves taking a blood sample to check your fasting blood sugar level. This should be done once every 3 years, after age 2, if you are within normal weight and without risk factors for diabetes. Testing should be considered at a younger age or be carried out more frequently if you are overweight and have at least 1 risk factor for diabetes.  Breast cancer screening is essential preventative care for women. You should practice "breast self-awareness." This means understanding the normal appearance and feel of your breasts and may include breast self-examination. Any changes detected, no matter how small, should be reported to a caregiver. Women in their 70s and 30s should have a clinical breast exam (CBE) by a caregiver as part of a regular health exam every 1 to 3 years. After age 77, women should have a CBE every year. Starting at age 64, women should consider having a mammogram (breast X-ray) every year. Women who have a family history of breast cancer should talk to their caregiver about genetic screening. Women at a high risk of breast cancer should talk to their caregiver about having an MRI and a mammogram every year.  Breast cancer gene (BRCA)-related cancer risk assessment is recommended for women who have family members with BRCA-related cancers. BRCA-related cancers include breast, ovarian, tubal, and peritoneal cancers. Having family members with these cancers may be associated with an increased risk for harmful changes (mutations) in the breast cancer genes BRCA1 and BRCA2. Results of the assessment will determine the need for genetic counseling and BRCA1 and BRCA2 testing.  The Pap test is a screening test for cervical cancer. Women should have a Pap test starting at age 44. Between ages 66 and 55, Pap tests should be repeated every 2 years. Beginning at age 13, you should have a Pap test every 3 years as long as the past 3 Pap tests have been normal. If you had a hysterectomy for a problem that  was not cancer or a condition that could lead to cancer, then you no longer need Pap tests. If you are between ages 75 and 60, and you have had normal Pap tests going back 10 years, you no longer need Pap tests. If you have had past treatment for cervical cancer or a condition that could lead to cancer, you need Pap tests and screening for cancer for at least 20 years after your treatment. If Pap tests have been discontinued, risk factors (such as a new sexual partner) need to be reassessed to determine if screening should be resumed. Some women have medical problems that increase the chance of getting cervical cancer. In these cases, your caregiver may recommend more frequent screening and Pap tests.  The human papillomavirus (HPV) test is an additional test that may be used for cervical cancer screening. The HPV test looks for the virus that can cause the cell changes on the cervix. The cells collected during the Pap test can be tested for HPV. The HPV test could  be used to screen women aged 67 years and older, and should be used in women of any age who have unclear Pap test results. After the age of 12, women should have HPV testing at the same frequency as a Pap test.  Colorectal cancer can be detected and often prevented. Most routine colorectal cancer screening begins at the age of 26 and continues through age 56. However, your caregiver may recommend screening at an earlier age if you have risk factors for colon cancer. On a yearly basis, your caregiver may provide home test kits to check for hidden blood in the stool. Use of a small camera at the end of a tube, to directly examine the colon (sigmoidoscopy or colonoscopy), can detect the earliest forms of colorectal cancer. Talk to your caregiver about this at age 6, when routine screening begins. Direct examination of the colon should be repeated every 5 to 10 years through age 67, unless early forms of pre-cancerous polyps or small growths are  found.  Hepatitis C blood testing is recommended for all people born from [birth year] through 1965 and any individual with known risks for hepatitis C.  Practice safe sex. Use condoms and avoid high-risk sexual practices to reduce the spread of sexually transmitted infections (STIs). Sexually active women aged 8 and younger should be checked for Chlamydia, which is a common sexually transmitted infection. Older women with new or multiple partners should also be tested for Chlamydia. Testing for other STIs is recommended if you are sexually active and at increased risk.  Osteoporosis is a disease in which the bones lose minerals and strength with aging. This can result in serious bone fractures. The risk of osteoporosis can be identified using a bone density scan. Women ages 69 and over and women at risk for fractures or osteoporosis should discuss screening with their caregivers. Ask your caregiver whether you should be taking a calcium supplement or vitamin D to reduce the rate of osteoporosis.  Menopause can be associated with physical symptoms and risks. Hormone replacement therapy is available to decrease symptoms and risks. You should talk to your caregiver about whether hormone replacement therapy is right for you.  Use sunscreen. Apply sunscreen liberally and repeatedly throughout the day. You should seek shade when your shadow is shorter than you. Protect yourself by wearing long sleeves, pants, a wide-brimmed hat, and sunglasses year round, whenever you are outdoors.  Notify your caregiver of new moles or changes in moles, especially if there is a change in shape or color. Also notify your caregiver if a mole is larger than the size of a pencil eraser.  Stay current with your immunizations. Document Released: 01/27/2011 Document Revised: 11/08/2012 Document Reviewed: 01/27/2011 [facility] Patient Information 2014 [place].

## 2015-02-23 LAB — URINALYSIS W MICROSCOPIC + REFLEX CULTURE
Bilirubin Urine: NEGATIVE
Casts: NONE SEEN [LPF]
Glucose, UA: NEGATIVE
Hgb urine dipstick: NEGATIVE
Nitrite: NEGATIVE
Protein, ur: NEGATIVE
RBC / HPF: NONE SEEN RBC/HPF (ref <=2)
Specific Gravity, Urine: 1.026 (ref 1.001–1.035)
Yeast: NONE SEEN [HPF]
pH: 5.5 (ref 5.0–8.0)

## 2015-02-25 LAB — URINE CULTURE

## 2015-02-26 ENCOUNTER — Other Ambulatory Visit: Payer: Self-pay | Admitting: *Deleted

## 2015-02-26 MED ORDER — CIPROFLOXACIN HCL 250 MG PO TABS: 250.0000 mg | ORAL_TABLET | Freq: Two times a day (BID) | ORAL | Status: DC

## 2015-12-05 DIAGNOSIS — E119 Type 2 diabetes mellitus without complications: Secondary | ICD-10-CM | POA: Diagnosis not present

## 2015-12-05 DIAGNOSIS — E785 Hyperlipidemia, unspecified: Secondary | ICD-10-CM | POA: Diagnosis not present

## 2016-01-03 ENCOUNTER — Other Ambulatory Visit: Payer: Self-pay | Admitting: Gynecology

## 2016-01-03 DIAGNOSIS — M8580 Other specified disorders of bone density and structure, unspecified site: Secondary | ICD-10-CM

## 2016-01-03 DIAGNOSIS — Z13820 Encounter for screening for osteoporosis: Secondary | ICD-10-CM

## 2016-01-16 DIAGNOSIS — Z23 Encounter for immunization: Secondary | ICD-10-CM | POA: Diagnosis not present

## 2016-01-16 DIAGNOSIS — K219 Gastro-esophageal reflux disease without esophagitis: Secondary | ICD-10-CM | POA: Diagnosis not present

## 2016-01-16 DIAGNOSIS — E119 Type 2 diabetes mellitus without complications: Secondary | ICD-10-CM | POA: Diagnosis not present

## 2016-01-16 DIAGNOSIS — R110 Nausea: Secondary | ICD-10-CM | POA: Diagnosis not present

## 2016-01-16 DIAGNOSIS — F4320 Adjustment disorder, unspecified: Secondary | ICD-10-CM | POA: Diagnosis not present

## 2016-01-22 ENCOUNTER — Ambulatory Visit (INDEPENDENT_AMBULATORY_CARE_PROVIDER_SITE_OTHER): Payer: BLUE CROSS/BLUE SHIELD | Admitting: Cardiovascular Disease

## 2016-01-22 ENCOUNTER — Encounter: Payer: Self-pay | Admitting: Cardiovascular Disease

## 2016-01-22 VITALS — BP 131/88 | HR 74 | Ht 60.0 in | Wt 188.4 lb

## 2016-01-22 DIAGNOSIS — I10 Essential (primary) hypertension: Secondary | ICD-10-CM | POA: Diagnosis not present

## 2016-01-22 DIAGNOSIS — E785 Hyperlipidemia, unspecified: Secondary | ICD-10-CM

## 2016-01-22 NOTE — Assessment & Plan Note (Signed)
History of hyperlipidemia on statin therapy followed by his PCP 

## 2016-01-22 NOTE — Patient Instructions (Signed)
Medication Instructions:  Your physician recommends that you continue on your current medications as directed. Please refer to the Current Medication list given to you today.   Labwork: I will request lab work from your PCP.  Follow-Up: Your physician wants you to follow-up in: 12 MONTHS WITH DR BERRY. You will receive a reminder letter in the mail two months in advance. If you don't receive a letter, please call our office to schedule the follow-up appointment.   Any Other Special Instructions Will Be Listed Below (If Applicable).     If you need a refill on your cardiac medications before your next appointment, please call your pharmacy.   

## 2016-01-22 NOTE — Assessment & Plan Note (Signed)
History of hypertension blood pressure measured at 131/88. She is on Avapro. Continue current meds at current dosing

## 2016-01-22 NOTE — Progress Notes (Signed)
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 I last saw her in the office 12/29/14. I have been seeing her for diastolic dysfunction with risk factors that include diabetes, hypertension, and hyperlipidemia. She has normal LV systolic function with a negative Myoview. She has obstructive sleep apnea, on CPAP.Dr. Tenny Crawoss recently checked her lipid profile. Since I saw her 1-1/2 years ago she denies chest pain or shortness of breath. She was taken off of Crestor because of myalgias and had a lipid profile performed 07/13/14 revealed a total cholesterol 223, LDL of 145 and HDL of 47. She was placed back on pravastatin.   Current Outpatient Prescriptions  Medication Sig Dispense Refill  . ALPRAZolam (XANAX) 0.5 MG tablet Take 0.5 mg by mouth at bedtime as needed for anxiety.    Marland Kitchen. aspirin 81 MG tablet Take 81 mg by mouth daily.    . Cholecalciferol (VITAMIN D-3 PO) Take by mouth daily. Reported on 01/22/2016    . Cinnamon 500 MG capsule Take 1,000 mg by mouth daily.    . Coenzyme Q10 (CO Q-10) 200 MG CAPS Take 1 capsule by mouth daily.    Marland Kitchen. escitalopram (LEXAPRO) 20 MG tablet Take 20 mg by mouth every other day.     . fluconazole (DIFLUCAN) 150 MG tablet Take 1 tablet (150 mg total) by mouth once. As needed for yeast 5 tablet 0  . ibuprofen (ADVIL,MOTRIN) 200 MG tablet Take 200 mg by mouth every 6 (six) hours as needed. Pain      . irbesartan (AVAPRO) 75 MG tablet Take 75 mg by mouth daily.    . metFORMIN (GLUCOPHAGE-XR) 500 MG 24 hr tablet Take 500 mg by mouth 2 (two) times daily.      Marland Kitchen. nystatin-triamcinolone ointment (MYCOLOG) Apply 1 application topically 2 (two) times daily. 30 g 0  . omeprazole (PRILOSEC) 20 MG  capsule Take 20 mg by mouth 2 (two) times daily.     Marland Kitchen. OVER THE COUNTER MEDICATION daily. Estroven    . pravastatin (PRAVACHOL) 40 MG tablet Take 40 mg by mouth daily.  2  . promethazine (PHENERGAN) 25 MG tablet Take 25 mg by mouth every 6 (six) hours as needed for nausea or vomiting.     No current facility-administered medications for this visit.    Allergies  Allergen Reactions  . Cephalexin Itching  . Sulfa Antibiotics Nausea Only    Social History   Social History  . Marital Status: Married    Spouse Name: N/A  . Number of Children: N/A  . Years of Education: N/A   Occupational History  . Not on file.   Social History Main Topics  . Smoking status: Never Smoker   . Smokeless tobacco: Never Used  . Alcohol Use: No  . Drug Use: No  . Sexual Activity: Not Currently    Birth Control/ Protection: Post-menopausal     Comment: 1st intercourse 60 yo-Fewer than 5 partners   Other Topics Concern  . Not on file   Social History Narrative     Review of Systems: General: negative for chills, fever, night sweats or weight changes.  Cardiovascular: negative for chest pain, dyspnea on exertion, edema, orthopnea, palpitations, paroxysmal nocturnal dyspnea or  shortness of breath Dermatological: negative for rash Respiratory: negative for cough or wheezing Urologic: negative for hematuria Abdominal: negative for nausea, vomiting, diarrhea, bright red blood per rectum, melena, or hematemesis Neurologic: negative for visual changes, syncope, or dizziness All other systems reviewed and are otherwise negative except as noted above.    Blood pressure 131/88, pulse 74, height 5' (1.524 m), weight 188 lb 6 oz (85.446 kg).  General appearance: alert and no distress Neck: no adenopathy, no carotid bruit, no JVD, supple, symmetrical, trachea midline and thyroid not enlarged, symmetric, no tenderness/mass/nodules Lungs: clear to auscultation bilaterally Heart: regular rate and rhythm,  S1, S2 normal, no murmur, click, rub or gallop Extremities: extremities normal, atraumatic, no cyanosis or edema  EKG normal sinus rhythm at 75 with borderline LVH voltage and inferior Q waves. I placed the reviewed this EKG  ASSESSMENT AND PLAN:   Hyperlipemia History of hyperlipidemia on statin therapy followed by his PCP  Hypertension History of hypertension blood pressure measured at 131/88. She is on Avapro. Continue current meds at current dosing      Runell GessJonathan J. Berry MD [facility] CenterFACP,FACC,FAHA, [facility] CenterFSCAI 01/22/2016 9:07 AM

## 2016-02-19 ENCOUNTER — Ambulatory Visit (INDEPENDENT_AMBULATORY_CARE_PROVIDER_SITE_OTHER): Payer: BLUE CROSS/BLUE SHIELD

## 2016-02-19 DIAGNOSIS — M8580 Other specified disorders of bone density and structure, unspecified site: Secondary | ICD-10-CM

## 2016-02-19 DIAGNOSIS — Z13820 Encounter for screening for osteoporosis: Secondary | ICD-10-CM

## 2016-02-19 DIAGNOSIS — M810 Age-related osteoporosis without current pathological fracture: Secondary | ICD-10-CM

## 2016-02-21 ENCOUNTER — Other Ambulatory Visit: Payer: Self-pay | Admitting: Gynecology

## 2016-02-21 ENCOUNTER — Telehealth: Payer: Self-pay | Admitting: Gynecology

## 2016-02-21 DIAGNOSIS — M810 Age-related osteoporosis without current pathological fracture: Secondary | ICD-10-CM

## 2016-02-21 NOTE — Telephone Encounter (Signed)
Pt informed with the below note has annual schedule on 02/25/16 will speak with provider then,.

## 2016-02-21 NOTE — Telephone Encounter (Signed)
Left message for pt to call.

## 2016-02-21 NOTE — Telephone Encounter (Signed)
Tell patient her most recent bone density did show osteoporosis. Recommend office visit to discuss treatment options.

## 2016-02-22 DIAGNOSIS — K219 Gastro-esophageal reflux disease without esophagitis: Secondary | ICD-10-CM | POA: Diagnosis not present

## 2016-02-22 DIAGNOSIS — E119 Type 2 diabetes mellitus without complications: Secondary | ICD-10-CM | POA: Diagnosis not present

## 2016-02-22 DIAGNOSIS — E785 Hyperlipidemia, unspecified: Secondary | ICD-10-CM | POA: Diagnosis not present

## 2016-02-25 ENCOUNTER — Encounter: Payer: Self-pay | Admitting: Gynecology

## 2016-02-25 ENCOUNTER — Ambulatory Visit (INDEPENDENT_AMBULATORY_CARE_PROVIDER_SITE_OTHER): Payer: BLUE CROSS/BLUE SHIELD | Admitting: Gynecology

## 2016-02-25 VITALS — BP 120/78 | Ht 61.0 in | Wt 190.0 lb

## 2016-02-25 DIAGNOSIS — N952 Postmenopausal atrophic vaginitis: Secondary | ICD-10-CM | POA: Diagnosis not present

## 2016-02-25 DIAGNOSIS — Z01419 Encounter for gynecological examination (general) (routine) without abnormal findings: Secondary | ICD-10-CM

## 2016-02-25 DIAGNOSIS — M810 Age-related osteoporosis without current pathological fracture: Secondary | ICD-10-CM | POA: Diagnosis not present

## 2016-02-25 DIAGNOSIS — Z1231 Encounter for screening mammogram for malignant neoplasm of breast: Secondary | ICD-10-CM | POA: Diagnosis not present

## 2016-02-25 MED ORDER — FLUCONAZOLE 150 MG PO TABS: 150.0000 mg | ORAL_TABLET | Freq: Once | ORAL | 0 refills | Status: AC

## 2016-02-25 NOTE — Progress Notes (Signed)
    Crystal Andrews 1955/09/23 [id number]        60 y.o.  G3P2011  for annual exam.  Several issues noted below.  Past medical history,surgical history, problem list, medications, allergies, family history and social history were all reviewed and documented as reviewed in the EPIC chart.  ROS:  Performed with pertinent positives and negatives included in the history, assessment and plan.   Additional significant findings :  None   Exam: Kennon Portela assistant Vitals:   02/25/16 0948  BP: 120/78  Weight: 190 lb (86.2 kg)  Height: 5\' 1"  (1.549 m)   General appearance:  Normal affect, orientation and appearance. Skin: Grossly normal HEENT: Without gross lesions.  No cervical or supraclavicular adenopathy. Thyroid normal.  Lungs:  Clear without wheezing, rales or rhonchi Cardiac: RR, without RMG Abdominal:  Soft, nontender, without masses, guarding, rebound, organomegaly or hernia Breasts:  Examined lying and sitting without masses, retractions, discharge or axillary adenopathy. Pelvic:  Ext/BUS/Vagina with atrophic changes  Cervix high in the vaginal vault flush with upper vagina.  Uterus difficult to palpate but no gross masses or tenderness gross  Adnexa without masses or tenderness    Anus and perineum normal   Rectovaginal normal sphincter tone without palpated masses or tenderness.    Assessment/Plan:  60 y.o. G60P2011 female for annual exam.   1. Postmenopausal/atrophic genital changes. Without significant hot flushes, night sweats, vaginal dryness, vaginal bleeding. Continue to monitor and report any issues or vaginal bleeding. 2. Osteoporosis. DEXA 2017 T score -3.0 distal third of the radius. Basically stable at other sites from prior 2015 DEXA. Spine excluded duty degenerative changes. Reviewed options to include treatment now such as Fosamax weekly. Issues of medication and risks to include GERD, osteonecrosis of the jaw, atypical fractures reviewed. Expectant management with  risks of progression of her bone loss with increasing fracture risk in the future also reviewed. At this point patient elects for observation with planned repeat DEXA in 2 years. She understands the possibility of progression and worsening bone loss and fracture risk. Reports recent vitamin D level through her primary physician's office. Is supplementing with extra vitamin D daily. 1200 mg to 1500 mg total dietary calcium recommended. 3. Colonoscopy due now and patient knows to call and schedule. 4. Mammography today. Continue with annual mammography. SBE monthly reviewed. 5. Pap smear/HPV 2015 negative. No Pap smear done today. History of carcinoma in situ of the cervix 1989 with normal Pap smears since then. Plan repeat Pap smear at 3-5 year interval. 6. Health maintenance. No routine blood work done as this is done at her primary physician's office. I did refill her Diflucan 150 mg #5 for intermittent yeast vaginitis that she gets throughout the year. Follow up in one year, sooner as needed.   [name] MD, 10:27 AM 02/25/2016

## 2016-02-25 NOTE — Patient Instructions (Signed)

## 2016-03-10 ENCOUNTER — Encounter: Payer: Self-pay | Admitting: Gynecology

## 2016-04-15 DIAGNOSIS — J029 Acute pharyngitis, unspecified: Secondary | ICD-10-CM | POA: Diagnosis not present

## 2016-05-05 DIAGNOSIS — L718 Other rosacea: Secondary | ICD-10-CM | POA: Diagnosis not present

## 2016-05-05 DIAGNOSIS — L858 Other specified epidermal thickening: Secondary | ICD-10-CM | POA: Diagnosis not present

## 2016-05-05 DIAGNOSIS — D2262 Melanocytic nevi of left upper limb, including shoulder: Secondary | ICD-10-CM | POA: Diagnosis not present

## 2016-05-05 DIAGNOSIS — I788 Other diseases of capillaries: Secondary | ICD-10-CM | POA: Diagnosis not present

## 2016-05-08 DIAGNOSIS — Z23 Encounter for immunization: Secondary | ICD-10-CM | POA: Diagnosis not present

## 2016-05-19 DIAGNOSIS — E785 Hyperlipidemia, unspecified: Secondary | ICD-10-CM | POA: Diagnosis not present

## 2016-05-19 DIAGNOSIS — K219 Gastro-esophageal reflux disease without esophagitis: Secondary | ICD-10-CM | POA: Diagnosis not present

## 2016-05-19 DIAGNOSIS — E119 Type 2 diabetes mellitus without complications: Secondary | ICD-10-CM | POA: Diagnosis not present

## 2016-07-02 DIAGNOSIS — Z0000 Encounter for general adult medical examination without abnormal findings: Secondary | ICD-10-CM | POA: Diagnosis not present

## 2016-07-02 DIAGNOSIS — E782 Mixed hyperlipidemia: Secondary | ICD-10-CM | POA: Diagnosis not present

## 2016-07-02 DIAGNOSIS — I10 Essential (primary) hypertension: Secondary | ICD-10-CM | POA: Diagnosis not present

## 2016-07-02 DIAGNOSIS — Z6836 Body mass index (BMI) 36.0-36.9, adult: Secondary | ICD-10-CM | POA: Diagnosis not present

## 2016-07-02 DIAGNOSIS — K219 Gastro-esophageal reflux disease without esophagitis: Secondary | ICD-10-CM | POA: Diagnosis not present

## 2016-07-16 DIAGNOSIS — H2513 Age-related nuclear cataract, bilateral: Secondary | ICD-10-CM | POA: Diagnosis not present

## 2016-07-16 DIAGNOSIS — E119 Type 2 diabetes mellitus without complications: Secondary | ICD-10-CM | POA: Diagnosis not present

## 2016-09-01 DIAGNOSIS — K219 Gastro-esophageal reflux disease without esophagitis: Secondary | ICD-10-CM | POA: Diagnosis not present

## 2016-09-01 DIAGNOSIS — E785 Hyperlipidemia, unspecified: Secondary | ICD-10-CM | POA: Diagnosis not present

## 2016-09-01 DIAGNOSIS — E119 Type 2 diabetes mellitus without complications: Secondary | ICD-10-CM | POA: Diagnosis not present

## 2017-01-05 DIAGNOSIS — Z1159 Encounter for screening for other viral diseases: Secondary | ICD-10-CM | POA: Diagnosis not present

## 2017-01-05 DIAGNOSIS — I10 Essential (primary) hypertension: Secondary | ICD-10-CM | POA: Diagnosis not present

## 2017-01-05 DIAGNOSIS — E119 Type 2 diabetes mellitus without complications: Secondary | ICD-10-CM | POA: Diagnosis not present

## 2017-01-12 DIAGNOSIS — E119 Type 2 diabetes mellitus without complications: Secondary | ICD-10-CM | POA: Diagnosis not present

## 2017-01-12 DIAGNOSIS — E785 Hyperlipidemia, unspecified: Secondary | ICD-10-CM | POA: Diagnosis not present

## 2017-01-12 DIAGNOSIS — K219 Gastro-esophageal reflux disease without esophagitis: Secondary | ICD-10-CM | POA: Diagnosis not present

## 2017-01-21 ENCOUNTER — Encounter: Payer: Self-pay | Admitting: Cardiovascular Disease

## 2017-01-21 ENCOUNTER — Ambulatory Visit (INDEPENDENT_AMBULATORY_CARE_PROVIDER_SITE_OTHER): Payer: BLUE CROSS/BLUE SHIELD | Admitting: Cardiovascular Disease

## 2017-01-21 VITALS — BP 140/92 | HR 76 | Ht 60.0 in | Wt 189.0 lb

## 2017-01-21 DIAGNOSIS — G4733 Obstructive sleep apnea (adult) (pediatric): Secondary | ICD-10-CM

## 2017-01-21 DIAGNOSIS — E7800 Pure hypercholesterolemia, unspecified: Secondary | ICD-10-CM | POA: Diagnosis not present

## 2017-01-21 DIAGNOSIS — I10 Essential (primary) hypertension: Secondary | ICD-10-CM | POA: Diagnosis not present

## 2017-01-21 NOTE — Patient Instructions (Signed)
Medication Instructions: Your physician recommends that you continue on your current medications as directed. Please refer to the Current Medication list given to you today.   Labwork: I will request labs from [name]'s office.   Follow-Up:  Crystal Andrews will call you this afternoon about your CPAP.  Your physician wants you to follow-up in: 1 year with Dr. Allyson Andrews. You will receive a reminder letter in the mail two months in advance. If you don't receive a letter, please call our office to schedule the follow-up appointment.  If you need a refill on your cardiac medications before your next appointment, please call your pharmacy.

## 2017-01-21 NOTE — Assessment & Plan Note (Signed)
History of hyperlipidemia on statin therapy followed by her PCP. 

## 2017-01-21 NOTE — Assessment & Plan Note (Signed)
History of essential hypertension with blood pressure was rechecked at 140/92. She is on Avapro. She says her blood pressures but lower usually that it was today. Continue current meds at current dosing

## 2017-01-21 NOTE — Assessment & Plan Note (Signed)
History of obstructive sleep apnea currently not wearing CPAP because of "sores in her nose".She may need changes in the humidification. We will call her to further investigate.

## 2017-01-21 NOTE — Progress Notes (Signed)
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 I last saw her in the office 01/22/16. I have been seeing her for diastolic dysfunction with risk factors that include diabetes, hypertension, and hyperlipidemia. She has normal LV systolic function with a negative Myoview. She has obstructive sleep apnea, on CPAP.Dr. Tenny Craw recently checked her lipid profile. Since I saw her 1-1/2 years ago she denies chest pain or shortness of breath. She was taken off of Crestor because of myalgias and had a lipid profile performed 07/13/14 revealed a total cholesterol 223, LDL of 145 and HDL of 47. She was placed back on pravastatin.   Current Outpatient Prescriptions  Medication Sig Dispense Refill  . ALPRAZolam (XANAX) 0.5 MG tablet Take 0.5 mg by mouth at bedtime as needed for anxiety.    Marland Kitchen aspirin 81 MG tablet Take 81 mg by mouth daily.    . Cholecalciferol (VITAMIN D-3 PO) Take by mouth daily. Reported on 01/22/2016    . Cinnamon 500 MG capsule Take 1,000 mg by mouth daily.    . Coenzyme Q10 (CO Q-10) 200 MG CAPS Take 1 capsule by mouth daily.    Marland Kitchen escitalopram (LEXAPRO) 20 MG tablet Take 20 mg by mouth every other day.     . ibuprofen (ADVIL,MOTRIN) 200 MG tablet Take 200 mg by mouth every 6 (six) hours as needed. Pain      . irbesartan (AVAPRO) 75 MG tablet Take 75 mg by mouth daily.    . metFORMIN (GLUCOPHAGE-XR) 500 MG 24 hr tablet Take 500 mg by mouth 2 (two) times daily.      Marland Kitchen nystatin-triamcinolone ointment (MYCOLOG) Apply 1 application topically 2 (two) times daily. (Patient taking differently: Apply 1 application topically as needed. ) 30 g 0  . omeprazole (PRILOSEC) 20 MG capsule Take 20 mg by mouth every other  day.     Marland Kitchen OVER THE COUNTER MEDICATION daily. Estroven    . pravastatin (PRAVACHOL) 40 MG tablet Take 40 mg by mouth daily.  2  . promethazine (PHENERGAN) 25 MG tablet Take 25 mg by mouth every 6 (six) hours as needed for nausea or vomiting.     No current facility-administered medications for this visit.     Allergies  Allergen Reactions  . Cephalexin Itching  . Sulfa Antibiotics Nausea Only    Social History   Social History  . Marital status: Married    Spouse name: N/A  . Number of children: N/A  . Years of education: N/A   Occupational History  . Not on file.   Social History Main Topics  . Smoking status: Never Smoker  . Smokeless tobacco: Never Used  . Alcohol use No  . Drug use: No  . Sexual activity: Not Currently    Birth control/ protection: Post-menopausal     Comment: 1st intercourse 61 yo-Fewer than 5 partners   Other Topics Concern  . Not on file   Social History Narrative  . No narrative on file     Review of Systems: General: negative for chills, fever, night sweats or weight changes.  Cardiovascular: negative for chest pain, dyspnea on exertion, edema, orthopnea, palpitations, paroxysmal nocturnal dyspnea or shortness of breath Dermatological: negative for rash Respiratory:  negative for cough or wheezing Urologic: negative for hematuria Abdominal: negative for nausea, vomiting, diarrhea, bright red blood per rectum, melena, or hematemesis Neurologic: negative for visual changes, syncope, or dizziness All other systems reviewed and are otherwise negative except as noted above.    Blood pressure (!) 140/92, pulse 76, height 5' (1.524 m), weight 189 lb (85.7 kg).  General appearance: alert and no distress Neck: no adenopathy, no carotid bruit, no JVD, supple, symmetrical, trachea midline and thyroid not enlarged, symmetric, no tenderness/mass/nodules Lungs: clear to auscultation bilaterally Heart: regular rate and rhythm, S1, S2 normal, no  murmur, click, rub or gallop Extremities: extremities normal, atraumatic, no cyanosis or edema  EKG /76 with early R-wave transition and inferior Q waves in leads III and F. I personally reviewed this EKG.  ASSESSMENT AND PLAN:   Hyperlipemia History of hyperlipidemia on statin therapy followed by her PCP  Hypertension History of essential hypertension with blood pressure was rechecked at 140/92. She is on Avapro. She says her blood pressures but lower usually that it was today. Continue current meds at current dosing  Sleep apnea History of obstructive sleep apnea currently not wearing CPAP because of "sores in her nose".She may need changes in the humidification. We will call her to further investigate.      Runell GessJonathan J. Berry MD FACP,FACC,FAHA, [place] 01/21/2017 9:32 AM

## 2017-02-25 ENCOUNTER — Encounter: Payer: Self-pay | Admitting: Gynecology

## 2017-02-25 DIAGNOSIS — Z1231 Encounter for screening mammogram for malignant neoplasm of breast: Secondary | ICD-10-CM | POA: Diagnosis not present

## 2017-02-26 ENCOUNTER — Ambulatory Visit (INDEPENDENT_AMBULATORY_CARE_PROVIDER_SITE_OTHER): Payer: BLUE CROSS/BLUE SHIELD | Admitting: Gynecology

## 2017-02-26 ENCOUNTER — Encounter: Payer: Self-pay | Admitting: Gynecology

## 2017-02-26 VITALS — BP 126/80 | Ht 60.0 in | Wt 188.0 lb

## 2017-02-26 DIAGNOSIS — M810 Age-related osteoporosis without current pathological fracture: Secondary | ICD-10-CM

## 2017-02-26 DIAGNOSIS — N952 Postmenopausal atrophic vaginitis: Secondary | ICD-10-CM

## 2017-02-26 DIAGNOSIS — Z01411 Encounter for gynecological examination (general) (routine) with abnormal findings: Secondary | ICD-10-CM | POA: Diagnosis not present

## 2017-02-26 NOTE — Addendum Note (Signed)
Addended by: Kem ParkinsonBARNES, Michele Kerlin on: 02/26/2017 09:46 AM   Modules accepted: Orders

## 2017-02-26 NOTE — Progress Notes (Signed)
    Marrian SalvageCindy Alipio Jul 19, 1956 [id number]        61 y.o.  G3P2011 for annual exam.    Past medical history,surgical history, problem list, medications, allergies, family history and social history were all reviewed and documented as reviewed in the EPIC chart.  ROS:  Performed with pertinent positives and negatives included in the history, assessment and plan.   Additional significant findings :  None   Exam: Bari MantisKim Alexis assistant Vitals:   02/26/17 0821  BP: 126/80  Weight: 188 lb (85.3 kg)  Height: 5' (1.524 m)   Body mass index is 36.72 kg/m.  General appearance:  Normal affect, orientation and appearance. Skin: Grossly normal HEENT: Without gross lesions.  No cervical or supraclavicular adenopathy. Thyroid normal.  Lungs:  Clear without wheezing, rales or rhonchi Cardiac: RR, without RMG Abdominal:  Soft, nontender, without masses, guarding, rebound, organomegaly or hernia Breasts:  Examined lying and sitting without masses, retractions, discharge or axillary adenopathy. Pelvic:  Ext, BUS, Vagina: With atrophic changes  Cervix: With atrophic changes. Pap smear done  Uterus: Difficult to palpate. No gross masses or tenderness  Adnexa: Without gross masses or tenderness    Anus and perineum: Normal   Rectovaginal: Normal sphincter tone without palpated masses or tenderness.    Assessment/Plan:  61 y.o. 923P2011 female for annual exam.   1. Postmenopausal/atrophic genital changes. No significant hot flushes, night sweats, vaginal dryness or any vaginal bleeding. Continue to monitor report any issues or bleeding. 2. Osteoporosis. DEXA 2017 T score -3.0 distal third radius. Stable at other sites from prior DEXA 2015. Had discussed treatment options per 01/2016 note. Patient elected no treatment and we will plan on repeating her DEXA next year at 2 year interval. Increased calcium vitamin D reviewed. Patient relates having vitamin D levels checked at her primary physician  office. 3. Mammography 02/25/2017. Continue with annual mammography next year. Breast exam normal today. SBE monthly reviewed. 4. Colonoscopy overdue. Stressed the need to schedule and she agrees to do so. 5. Pap smear/HPV 2015. Pap smear done today noting history of carcinoma in situ previously 1989. Normal Pap smears since then. 6. Health maintenance. No routine lab work done as patient does this elsewhere. Follow up 1 year, sooner as needed.   Dara LordsFONTAINE,Glennis Montenegro P MD, 8:53 AM 02/26/2017

## 2017-02-26 NOTE — Patient Instructions (Signed)
Schedule your colonoscopy

## 2017-03-02 LAB — PAP IG W/ RFLX HPV ASCU

## 2017-03-06 DIAGNOSIS — K5792 Diverticulitis of intestine, part unspecified, without perforation or abscess without bleeding: Secondary | ICD-10-CM | POA: Diagnosis not present

## 2017-03-27 DIAGNOSIS — L239 Allergic contact dermatitis, unspecified cause: Secondary | ICD-10-CM | POA: Diagnosis not present

## 2017-03-31 DIAGNOSIS — L239 Allergic contact dermatitis, unspecified cause: Secondary | ICD-10-CM | POA: Diagnosis not present

## 2017-04-16 DIAGNOSIS — E119 Type 2 diabetes mellitus without complications: Secondary | ICD-10-CM | POA: Diagnosis not present

## 2017-04-16 DIAGNOSIS — K219 Gastro-esophageal reflux disease without esophagitis: Secondary | ICD-10-CM | POA: Diagnosis not present

## 2017-04-16 DIAGNOSIS — E785 Hyperlipidemia, unspecified: Secondary | ICD-10-CM | POA: Diagnosis not present

## 2017-05-07 DIAGNOSIS — Z23 Encounter for immunization: Secondary | ICD-10-CM | POA: Diagnosis not present

## 2017-07-03 DIAGNOSIS — K219 Gastro-esophageal reflux disease without esophagitis: Secondary | ICD-10-CM | POA: Diagnosis not present

## 2017-07-03 DIAGNOSIS — F4320 Adjustment disorder, unspecified: Secondary | ICD-10-CM | POA: Diagnosis not present

## 2017-07-03 DIAGNOSIS — R110 Nausea: Secondary | ICD-10-CM | POA: Diagnosis not present

## 2017-07-03 DIAGNOSIS — E119 Type 2 diabetes mellitus without complications: Secondary | ICD-10-CM | POA: Diagnosis not present

## 2017-07-03 DIAGNOSIS — Z0000 Encounter for general adult medical examination without abnormal findings: Secondary | ICD-10-CM | POA: Diagnosis not present

## 2017-07-11 DIAGNOSIS — J069 Acute upper respiratory infection, unspecified: Secondary | ICD-10-CM | POA: Diagnosis not present

## 2017-07-15 DIAGNOSIS — E119 Type 2 diabetes mellitus without complications: Secondary | ICD-10-CM | POA: Diagnosis not present

## 2017-07-15 DIAGNOSIS — Z0000 Encounter for general adult medical examination without abnormal findings: Secondary | ICD-10-CM | POA: Diagnosis not present

## 2017-07-16 DIAGNOSIS — K219 Gastro-esophageal reflux disease without esophagitis: Secondary | ICD-10-CM | POA: Diagnosis not present

## 2017-07-16 DIAGNOSIS — E7800 Pure hypercholesterolemia, unspecified: Secondary | ICD-10-CM | POA: Diagnosis not present

## 2017-07-16 DIAGNOSIS — E109 Type 1 diabetes mellitus without complications: Secondary | ICD-10-CM | POA: Diagnosis not present

## 2017-07-30 DIAGNOSIS — E119 Type 2 diabetes mellitus without complications: Secondary | ICD-10-CM | POA: Diagnosis not present

## 2017-07-30 DIAGNOSIS — H2513 Age-related nuclear cataract, bilateral: Secondary | ICD-10-CM | POA: Diagnosis not present

## 2017-08-03 DIAGNOSIS — I8311 Varicose veins of right lower extremity with inflammation: Secondary | ICD-10-CM | POA: Diagnosis not present

## 2017-08-03 DIAGNOSIS — L718 Other rosacea: Secondary | ICD-10-CM | POA: Diagnosis not present

## 2017-08-03 DIAGNOSIS — I8312 Varicose veins of left lower extremity with inflammation: Secondary | ICD-10-CM | POA: Diagnosis not present

## 2017-08-03 DIAGNOSIS — I872 Venous insufficiency (chronic) (peripheral): Secondary | ICD-10-CM | POA: Diagnosis not present

## 2017-10-15 DIAGNOSIS — E119 Type 2 diabetes mellitus without complications: Secondary | ICD-10-CM | POA: Diagnosis not present

## 2017-10-15 DIAGNOSIS — K219 Gastro-esophageal reflux disease without esophagitis: Secondary | ICD-10-CM | POA: Diagnosis not present

## 2017-10-15 DIAGNOSIS — E785 Hyperlipidemia, unspecified: Secondary | ICD-10-CM | POA: Diagnosis not present

## 2017-12-04 DIAGNOSIS — K5732 Diverticulitis of large intestine without perforation or abscess without bleeding: Secondary | ICD-10-CM | POA: Diagnosis not present

## 2017-12-09 ENCOUNTER — Encounter (HOSPITAL_BASED_OUTPATIENT_CLINIC_OR_DEPARTMENT_OTHER): Payer: Self-pay | Admitting: *Deleted

## 2017-12-09 ENCOUNTER — Other Ambulatory Visit: Payer: Self-pay

## 2017-12-09 ENCOUNTER — Emergency Department (HOSPITAL_BASED_OUTPATIENT_CLINIC_OR_DEPARTMENT_OTHER)
Admission: EM | Admit: 2017-12-09 | Discharge: 2017-12-09 | Disposition: A | Discharge status: Home / Self Care | Payer: BLUE CROSS/BLUE SHIELD | Attending: Emergency Medicine | Admitting: Emergency Medicine

## 2017-12-09 ENCOUNTER — Emergency Department (HOSPITAL_BASED_OUTPATIENT_CLINIC_OR_DEPARTMENT_OTHER): Payer: BLUE CROSS/BLUE SHIELD

## 2017-12-09 DIAGNOSIS — Z86001 Personal history of in-situ neoplasm of cervix uteri: Secondary | ICD-10-CM | POA: Diagnosis not present

## 2017-12-09 DIAGNOSIS — R1032 Left lower quadrant pain: Secondary | ICD-10-CM | POA: Diagnosis present

## 2017-12-09 DIAGNOSIS — N201 Calculus of ureter: Secondary | ICD-10-CM | POA: Diagnosis not present

## 2017-12-09 DIAGNOSIS — Z79899 Other long term (current) drug therapy: Secondary | ICD-10-CM | POA: Insufficient documentation

## 2017-12-09 DIAGNOSIS — I5030 Unspecified diastolic (congestive) heart failure: Secondary | ICD-10-CM | POA: Insufficient documentation

## 2017-12-09 DIAGNOSIS — E119 Type 2 diabetes mellitus without complications: Secondary | ICD-10-CM | POA: Insufficient documentation

## 2017-12-09 DIAGNOSIS — Z7982 Long term (current) use of aspirin: Secondary | ICD-10-CM | POA: Insufficient documentation

## 2017-12-09 DIAGNOSIS — R109 Unspecified abdominal pain: Secondary | ICD-10-CM | POA: Diagnosis not present

## 2017-12-09 DIAGNOSIS — Z7984 Long term (current) use of oral hypoglycemic drugs: Secondary | ICD-10-CM | POA: Insufficient documentation

## 2017-12-09 DIAGNOSIS — I110 Hypertensive heart disease with heart failure: Secondary | ICD-10-CM | POA: Insufficient documentation

## 2017-12-09 HISTORY — DX: Diverticulitis of intestine, part unspecified, without perforation or abscess without bleeding: K57.92

## 2017-12-09 LAB — CBC WITH DIFFERENTIAL/PLATELET
Basophils Absolute: 0 10*3/uL (ref 0.0–0.1)
Basophils Relative: 0 %
EOS ABS: 0.1 10*3/uL (ref 0.0–0.7)
EOS PCT: 1 %
HCT: 42.1 % (ref 36.0–46.0)
HEMOGLOBIN: 13.9 g/dL (ref 12.0–15.0)
LYMPHS ABS: 2.3 10*3/uL (ref 0.7–4.0)
LYMPHS PCT: 21 %
MCH: 25.9 pg — AB (ref 26.0–34.0)
MCHC: 33 g/dL (ref 30.0–36.0)
MCV: 78.5 fL (ref 78.0–100.0)
MONOS PCT: 6 %
Monocytes Absolute: 0.6 10*3/uL (ref 0.1–1.0)
Neutro Abs: 7.7 10*3/uL (ref 1.7–7.7)
Neutrophils Relative %: 72 %
PLATELETS: 295 10*3/uL (ref 150–400)
RBC: 5.36 MIL/uL — ABNORMAL HIGH (ref 3.87–5.11)
RDW: 17.4 % — ABNORMAL HIGH (ref 11.5–15.5)
WBC: 10.7 10*3/uL — ABNORMAL HIGH (ref 4.0–10.5)

## 2017-12-09 LAB — URINALYSIS, MICROSCOPIC (REFLEX): RBC / HPF: >50 RBC/hpf (ref 0–5)

## 2017-12-09 LAB — COMPREHENSIVE METABOLIC PANEL
ALK PHOS: 75 U/L (ref 38–126)
ALT: 39 U/L (ref 14–54)
ANION GAP: 14 (ref 5–15)
AST: 40 U/L (ref 15–41)
Albumin: 3.8 g/dL (ref 3.5–5.0)
BUN: 18 mg/dL (ref 6–20)
CALCIUM: 9.1 mg/dL (ref 8.9–10.3)
CO2: 22 mmol/L (ref 22–32)
CREATININE: 1.16 mg/dL — AB (ref 0.44–1.00)
Chloride: 102 mmol/L (ref 101–111)
GFR, EST AFRICAN AMERICAN: 57 mL/min — AB (ref >60)
GFR, EST NON AFRICAN AMERICAN: 49 mL/min — AB (ref >60)
Glucose, Bld: 124 mg/dL — ABNORMAL HIGH (ref 65–99)
Potassium: 4 mmol/L (ref 3.5–5.1)
SODIUM: 138 mmol/L (ref 135–145)
Total Bilirubin: 0.4 mg/dL (ref 0.3–1.2)
Total Protein: 7.5 g/dL (ref 6.5–8.1)

## 2017-12-09 LAB — URINALYSIS, ROUTINE W REFLEX MICROSCOPIC
BILIRUBIN URINE: NEGATIVE
GLUCOSE, UA: NEGATIVE mg/dL
Ketones, ur: 15 mg/dL — AB
Nitrite: NEGATIVE
Protein, ur: 30 mg/dL — AB
Specific Gravity, Urine: >1.03 — ABNORMAL HIGH (ref 1.005–1.030)
pH: 5 (ref 5.0–8.0)

## 2017-12-09 LAB — LIPASE, BLOOD: Lipase: 64 U/L — ABNORMAL HIGH (ref 11–51)

## 2017-12-09 MED ORDER — ONDANSETRON 4 MG PO TBDP: 4.0000 mg | ORAL_TABLET | Freq: Three times a day (TID) | ORAL | 0 refills | Status: DC | PRN

## 2017-12-09 MED ORDER — TAMSULOSIN HCL 0.4 MG PO CAPS: 0.4000 mg | ORAL_CAPSULE | Freq: Every day | ORAL | 0 refills | Status: DC

## 2017-12-09 MED ORDER — MORPHINE SULFATE (PF) 4 MG/ML IV SOLN
4.0000 mg | Freq: Once | INTRAVENOUS | Status: AC
  Administered 2017-12-09: 4 mg via INTRAVENOUS
  Filled 2017-12-09: qty 1

## 2017-12-09 MED ORDER — ONDANSETRON HCL 4 MG/2ML IJ SOLN
4.0000 mg | Freq: Once | INTRAMUSCULAR | Status: AC
  Administered 2017-12-09: 4 mg via INTRAVENOUS
  Filled 2017-12-09: qty 2

## 2017-12-09 MED ORDER — IOPAMIDOL (ISOVUE-300) INJECTION 61%
30.0000 mL | Freq: Once | INTRAVENOUS | Status: AC | PRN
  Administered 2017-12-09: 30 mL via ORAL

## 2017-12-09 MED ORDER — OXYCODONE-ACETAMINOPHEN 5-325 MG PO TABS: 1.0000 | ORAL_TABLET | Freq: Four times a day (QID) | ORAL | 0 refills | Status: DC | PRN

## 2017-12-09 MED ORDER — IOPAMIDOL (ISOVUE-300) INJECTION 61%
100.0000 mL | Freq: Once | INTRAVENOUS | Status: AC | PRN
  Administered 2017-12-09: 100 mL via INTRAVENOUS

## 2017-12-09 MED ORDER — SODIUM CHLORIDE 0.9 % IV BOLUS
1000.0000 mL | Freq: Once | INTRAVENOUS | Status: AC
  Administered 2017-12-09: 1000 mL via INTRAVENOUS

## 2017-12-09 MED ORDER — KETOROLAC TROMETHAMINE 30 MG/ML IJ SOLN
15.0000 mg | Freq: Once | INTRAMUSCULAR | Status: AC
  Administered 2017-12-09: 15 mg via INTRAVENOUS
  Filled 2017-12-09: qty 1

## 2017-12-09 MED ORDER — OXYCODONE-ACETAMINOPHEN 5-325 MG PO TABS
1.0000 | ORAL_TABLET | Freq: Once | ORAL | Status: AC
  Administered 2017-12-09: 1 via ORAL
  Filled 2017-12-09: qty 1

## 2017-12-09 NOTE — ED Provider Notes (Signed)
[facility] EMERGENCY DEPARTMENT Provider Note   CSN: [id number] Arrival date & time: 12/09/17  1336     History   Chief Complaint Chief Complaint  Patient presents with  . Abdominal Pain    HPI Crystal Andrews is a 62 y.o. female w PMHx diverticulitis, nephrolithiasis, hypertension, diabetes mellitus, GERD, presenting to the ED with left lower quadrant and left flank pain that began on Friday.  Patient states she was initially evaluated at [facility] urgent care who diagnosed her with diverticulitis via presentation and physical exam.  She was prescribed ciprofloxacin and Flagyl, and has been taking them as prescribed since Friday.  She states associated symptoms are nausea and vomiting.  States her pain improved throughout the weekend, however she began experiencing severe pain this morning with associated nausea and vomiting.  Abdominal pain is aching and intermittent, not positional.  No medications tried for pain prior to arrival.  She does endorse some dysuria and an episode of dark urine few days ago.  She states this does feel similar to diverticulitis, however does not fully recall how a kidney stone feels as she last had one when she was in elementary school age.  Denies increased frequency, fever, chills, vaginal bleeding or discharge, or any other complaints.  The history is provided by the patient.    Past Medical History:  Diagnosis Date  . CIS (carcinoma in situ of cervix) 1989   CONE BX WITH DR. MCPHAIL  . Diabetes mellitus   . Diastolic dysfunction   . Diverticulitis   . Ectopic pregnancy 1985  . GERD (gastroesophageal reflux disease)   . Hyperlipemia   . Hypertension   . Osteopenia 01/2014   T score -2.2 FRAX 17%/1.3%  . Sleep apnea     Patient Active Problem List   Diagnosis Date Noted  . Sleep apnea 07/15/2013  . GERD (gastroesophageal reflux disease)   . Hyperlipemia   . Ectopic pregnancy   . Hypertension   . Diabetes mellitus   . Osteopenia  02/03/2012    Past Surgical History:  Procedure Laterality Date  . CERVICAL CONE BIOPSY  1989    DR. MCPHAIL  . CESAREAN SECTION    . ECTOPIC PREGNANCY SURGERY    . EYE SURGERY    . left shoulder surgery    . TONSILLECTOMY       OB History    Gravida  3   Para  2   Term  2   Preterm      AB  1   Living  1     SAB      TAB      Ectopic  1   Multiple      Live Births               Home Medications    Prior to Admission medications   Medication Sig Start Date End Date Taking? Authorizing Provider  ciprofloxacin (CIPRO) 500 MG tablet Take 500 mg by mouth 2 (two) times daily.   Yes [provider]  metroNIDAZOLE (FLAGYL) 500 MG tablet Take 500 mg by mouth 3 (three) times daily.   Yes [provider]  ALPRAZolam Prudy Feeler) 0.5 MG tablet Take 0.5 mg by mouth at bedtime as needed for anxiety.    [provider]  aspirin 81 MG tablet Take 81 mg by mouth daily.    [provider]  Cholecalciferol (VITAMIN D-3 PO) Take by mouth daily. Reported on 01/22/2016    [provider]  Cinnamon 500 MG capsule Take 1,000 mg by mouth daily.    [provider]  Coenzyme Q10 (CO Q-10) 200 MG CAPS Take 1 capsule by mouth daily.    [provider]  escitalopram (LEXAPRO) 20 MG tablet Take 20 mg by mouth every other day.     [provider]  ibuprofen (ADVIL,MOTRIN) 200 MG tablet Take 200 mg by mouth every 6 (six) hours as needed. Pain      [provider]  irbesartan (AVAPRO) 75 MG tablet Take 75 mg by mouth daily.    [provider]  metFORMIN (GLUCOPHAGE-XR) 500 MG 24 hr tablet Take 500 mg by mouth 2 (two) times daily.      [provider]  nystatin-triamcinolone ointment (MYCOLOG) Apply 1 application topically 2 (two) times daily. Patient taking differently: Apply 1 application topically as needed.  02/22/15   Fontaine, Nadyne Coombes, MD  omeprazole (PRILOSEC) 20 MG capsule Take 20 mg by  mouth every other day.     [provider]  ondansetron (ZOFRAN ODT) 4 MG disintegrating tablet Take 1 tablet (4 mg total) by mouth every 8 (eight) hours as needed for nausea or vomiting. 12/09/17   Necia Kamm, [place] N, PA-C  OVER THE COUNTER MEDICATION daily. Estroven    [provider]  oxyCODONE-acetaminophen (PERCOCET/ROXICET) 5-325 MG tablet Take 1-2 tablets by mouth every 6 (six) hours as needed for severe pain. 12/09/17   Arnetra Terris, [place] N, PA-C  pravastatin (PRAVACHOL) 40 MG tablet Take 40 mg by mouth daily. 12/09/14   [provider]  promethazine (PHENERGAN) 25 MG tablet Take 25 mg by mouth every 6 (six) hours as needed for nausea or vomiting.    [provider]  tamsulosin (FLOMAX) 0.4 MG CAPS capsule Take 1 capsule (0.4 mg total) by mouth daily. 12/09/17   Kasten Leveque, [place] N, PA-C    Family History Family History  Problem Relation Age of Onset  . Heart disease Mother   . Heart disease Father   . Stroke Father   . Colon cancer Maternal Uncle   . Cancer Maternal Grandfather        Lung cancer  . Heart disease Paternal Grandmother   . Stroke Paternal Grandmother   . Heart disease Paternal Grandfather   . Stroke Paternal Grandfather   . Cancer Daughter        Brain tumor    Social History Social History   Tobacco Use  . Smoking status: Never Smoker  . Smokeless tobacco: Never Used  Substance Use Topics  . Alcohol use: No    Alcohol/week: 0.0 oz  . Drug use: No     Allergies   Cephalexin and Sulfa antibiotics   Review of Systems Review of Systems  Constitutional: Negative for chills and fever.  Gastrointestinal: Positive for abdominal pain, nausea and vomiting. Negative for blood in stool, constipation and diarrhea.  Genitourinary: Positive for dysuria and flank pain. Negative for frequency, vaginal bleeding and vaginal discharge.  All other systems reviewed and are negative.    Physical Exam Updated Vital Signs BP 136/73    Pulse 78   Temp (!) 97.5 F (36.4 C)   Resp 16   Ht 5' (1.524 m)   Wt 84.8 kg (187 lb)   SpO2 99%   BMI 36.52 kg/m   Physical Exam  Constitutional: She appears well-developed and well-nourished.  HENT:  Head: Normocephalic and atraumatic.  Mouth/Throat: Oropharynx is clear and moist.  Eyes: Conjunctivae are normal.  Cardiovascular: Normal rate, regular rhythm and intact distal pulses.  Pulmonary/Chest: Effort normal and breath sounds normal. No respiratory distress.  Abdominal: Soft. Normal appearance and bowel sounds are normal. She exhibits no distension and no mass. There is tenderness. There is CVA tenderness (left). There is no rebound and no guarding. No hernia.    Neurological: She is alert.  Skin: Skin is warm.  Psychiatric: She has a normal mood and affect. Her behavior is normal.  Nursing note and vitals reviewed.    ED Treatments / Results  Labs (all labs ordered are listed, but only abnormal results are displayed) Labs Reviewed  URINALYSIS, ROUTINE W REFLEX MICROSCOPIC - Abnormal; Notable for the following components:      Result Value   APPearance CLOUDY (*)    Specific Gravity, Urine >1.030 (*)    Hgb urine dipstick LARGE (*)    Ketones, ur 15 (*)    Protein, ur 30 (*)    Leukocytes, UA TRACE (*)    All other components within normal limits  CBC WITH DIFFERENTIAL/PLATELET - Abnormal; Notable for the following components:   WBC 10.7 (*)    RBC 5.36 (*)    MCH 25.9 (*)    RDW 17.4 (*)    All other components within normal limits  COMPREHENSIVE METABOLIC PANEL - Abnormal; Notable for the following components:   Glucose, Bld 124 (*)    Creatinine, Ser 1.16 (*)    GFR calc non Af Amer 49 (*)    GFR calc Af Amer 57 (*)    All other components within normal limits  LIPASE, BLOOD - Abnormal; Notable for the following components:   Lipase 64 (*)    All other components within normal limits  URINALYSIS, MICROSCOPIC (REFLEX) - Abnormal; Notable for the  following components:   Bacteria, UA FEW (*)    All other components within normal limits    EKG None  Radiology Ct Abdomen Pelvis W Contrast  Result Date: 12/09/2017 CLINICAL DATA:  Left-sided abdominal pain and nausea for 1 week. Nephrolithiasis. Previous diverticulitis. EXAM: CT ABDOMEN AND PELVIS WITH CONTRAST TECHNIQUE: Multidetector CT imaging of the abdomen and pelvis was performed using the standard protocol following bolus administration of intravenous contrast. CONTRAST:  ISOVUE-300 IOPAMIDOL (ISOVUE-300) INJECTION 61% COMPARISON:  None. FINDINGS: Lower Chest: No acute findings. Hepatobiliary: Tiny low-attenuation lesion in the posterior right hepatic lobe is too small to characterize but most likely represents a tiny cyst. No definite liver masses are identified. Gallbladder is unremarkable. No evidence of biliary ductal dilatation. Pancreas:  No mass or inflammatory changes. Spleen: Within normal limits in size and appearance. Adrenals/Urinary Tract: No masses identified. Mild bilateral renal parenchymal scarring. Tiny left renal cysts noted. Mild left hydroureteronephrosis is seen due to a 5 mm calculus in the distal left ureter. Unremarkable unopacified urinary bladder. Stomach/Bowel: No evidence of obstruction, inflammatory process or abnormal fluid collections. Diverticulosis is seen mainly involving the descending and sigmoid colon, however there is no evidence of diverticulitis. Normal appendix visualized. Vascular/Lymphatic: No pathologically enlarged lymph nodes. No abdominal aortic aneurysm. Aortic atherosclerosis. Reproductive:  No mass or other significant abnormality. Other:  None. Musculoskeletal:  No suspicious bone lesions identified. IMPRESSION: Mild left hydroureteronephrosis due to 5 mm distal left ureteral calculus. Colonic diverticulosis, without radiographic evidence of diverticulitis. Electronically Signed   By: Myles Rosenthal M.D.   On: 12/09/2017 17:30     Procedures Procedures (including critical care time)  Medications Ordered in ED Medications  sodium chloride 0.9 %  bolus 1,000 mL (0 mLs Intravenous Stopped 12/09/17 1804)  ondansetron (ZOFRAN) injection 4 mg (4 mg Intravenous Given 12/09/17 1448)  morphine 4 MG/ML injection 4 mg (4 mg Intravenous Given 12/09/17 1448)  iopamidol (ISOVUE-300) 61 % injection 30 mL (30 mLs Oral Contrast Given 12/09/17 1505)  iopamidol (ISOVUE-300) 61 % injection 100 mL (100 mLs Intravenous Contrast Given 12/09/17 1705)  ketorolac (TORADOL) 30 MG/ML injection 15 mg (15 mg Intravenous Given 12/09/17 1804)  oxyCODONE-acetaminophen (PERCOCET/ROXICET) 5-325 MG per tablet 1 tablet (1 tablet Oral Given 12/09/17 1804)  ondansetron (ZOFRAN) injection 4 mg (4 mg Intravenous Given 12/09/17 1804)     Initial Impression / Assessment and Plan / ED Course  I have reviewed the triage vital signs and the nursing notes.  Pertinent labs & imaging results that were available during my care of the patient were reviewed by me and considered in my medical decision making (see chart for details).     Patient with past medical history diverticulitis and nephrolithiasis, presenting to the ED with left lower quadrant and left flank pain since Friday.  Currently being treated with Cipro and Flagyl for diverticulitis, however pain worsened today.  Abdomen is soft no peritoneal signs.  Left lower quadrant and left flank tenderness.  Labs pending.  Will order IV fluids, pain medication, Zofran, and CT abdomen pelvis to evaluate for diverticulitis vs nephrolithiasis.  CT resulting with 5mm left ureteral stone with mild hydronephrosis. Diverticulosis without diverticulitis. U/A with large hgb and Ca oxalate crystals. No evidence of UTI. Cr slightly elev at 1.16. Afebrile, VSS. No concern for pyelonephritis. Pt's pain and nausea improved w intervention, though returning on re-evaluation. Will administer another dose of pain medication and zofran  prior to discharge. Pt is tolerating PO fluids. Pt will be discharged with percocet, zofran, and flomax. Urology referral for follow up and recheck creatinine. Discontinue abx. Safe for discharge.  Discussed results, findings, treatment and follow up. Patient advised of return precautions. Patient verbalized understanding and agreed with plan.  Final Clinical Impressions(s) / ED Diagnoses   Final diagnoses:  Left ureteral stone    ED Discharge Orders        Ordered    oxyCODONE-acetaminophen (PERCOCET/ROXICET) 5-325 MG tablet  Every 6 hours PRN     12/09/17 1814    ondansetron (ZOFRAN ODT) 4 MG disintegrating tablet  Every 8 hours PRN     12/09/17 1814    tamsulosin (FLOMAX) 0.4 MG CAPS capsule  Daily     12/09/17 1814       Dianne Bady, [place] N, PA-C 12/09/17 1818    Pricilla Loveless, MD 12/11/17 1458

## 2017-12-09 NOTE — ED Notes (Signed)
disposed of abx meds per pt request cipro and flagyl place [facility] pharm black box

## 2017-12-09 NOTE — ED Notes (Signed)
Patient transported to CT 

## 2017-12-09 NOTE — Discharge Instructions (Addendum)
Please read instructions below. Drink plenty of water. You can take Percocet every 6 hours as needed for pain.  Do not drive, drink alcohol, or take Tylenol taking this medication.  Can take ibuprofen in addition as needed for added pain relief. You can take Zofran every 6 hours as needed for nausea. Take flomax once per day for bladder spasm. Follow up with Urology if you have not passed the stone in the next few days. Return to the ER for fever, chills, uncontrollable vomiting, or worsening symptoms.

## 2017-12-09 NOTE — ED Triage Notes (Signed)
Pt c/o left lower abd pain x 1 week , dx diverticulitis x 6 days ago

## 2017-12-10 DIAGNOSIS — N201 Calculus of ureter: Secondary | ICD-10-CM | POA: Diagnosis not present

## 2017-12-10 DIAGNOSIS — N281 Cyst of kidney, acquired: Secondary | ICD-10-CM | POA: Diagnosis not present

## 2017-12-25 DIAGNOSIS — N201 Calculus of ureter: Secondary | ICD-10-CM | POA: Diagnosis not present

## 2018-01-07 DIAGNOSIS — E119 Type 2 diabetes mellitus without complications: Secondary | ICD-10-CM | POA: Diagnosis not present

## 2018-01-13 DIAGNOSIS — E119 Type 2 diabetes mellitus without complications: Secondary | ICD-10-CM | POA: Diagnosis not present

## 2018-01-19 DIAGNOSIS — E785 Hyperlipidemia, unspecified: Secondary | ICD-10-CM | POA: Diagnosis not present

## 2018-01-19 DIAGNOSIS — E119 Type 2 diabetes mellitus without complications: Secondary | ICD-10-CM | POA: Diagnosis not present

## 2018-01-19 DIAGNOSIS — K219 Gastro-esophageal reflux disease without esophagitis: Secondary | ICD-10-CM | POA: Diagnosis not present

## 2018-01-20 ENCOUNTER — Telehealth: Payer: Self-pay | Admitting: *Deleted

## 2018-01-20 DIAGNOSIS — M818 Other osteoporosis without current pathological fracture: Secondary | ICD-10-CM

## 2018-01-20 NOTE — Telephone Encounter (Signed)
Patient called asking when next dexa due per note on 02/26/17 "we will plan on repeating her DEXA next year at 2 year interval"  Order placed will have pt schedule.

## 2018-02-22 DIAGNOSIS — N201 Calculus of ureter: Secondary | ICD-10-CM | POA: Diagnosis not present

## 2018-02-23 ENCOUNTER — Other Ambulatory Visit: Payer: Self-pay | Admitting: Gynecology

## 2018-02-23 ENCOUNTER — Ambulatory Visit (INDEPENDENT_AMBULATORY_CARE_PROVIDER_SITE_OTHER): Payer: BLUE CROSS/BLUE SHIELD

## 2018-02-23 DIAGNOSIS — M810 Age-related osteoporosis without current pathological fracture: Secondary | ICD-10-CM | POA: Diagnosis not present

## 2018-02-23 DIAGNOSIS — M818 Other osteoporosis without current pathological fracture: Secondary | ICD-10-CM

## 2018-02-24 ENCOUNTER — Telehealth: Payer: Self-pay | Admitting: Gynecology

## 2018-02-24 NOTE — Telephone Encounter (Signed)
Tell patient most recent bone density is stable from prior study.  She continues to show osteoporosis at the distal forearm but does not appear to be worsening.  We had discussed treatment options in the past and she had declined treatment.  If she is interested in re-discussing this then recommend office visit.  Otherwise I would recommend repeating the bone density in 2 years.

## 2018-02-25 ENCOUNTER — Encounter: Payer: Self-pay | Admitting: Anesthesiology

## 2018-02-25 DIAGNOSIS — M810 Age-related osteoporosis without current pathological fracture: Secondary | ICD-10-CM

## 2018-02-25 HISTORY — DX: Age-related osteoporosis without current pathological fracture: M81.0

## 2018-02-25 NOTE — Telephone Encounter (Signed)
Left message to call.

## 2018-02-25 NOTE — Telephone Encounter (Signed)
Patient called in voice mail. Asked me to call her back and leave a detailed message if I got her voice mail. I called and per DPR access note on file I read her Dr. Kristie CowmanF's message about her BD result. Asked her to call me if any questions.

## 2018-03-03 ENCOUNTER — Ambulatory Visit (INDEPENDENT_AMBULATORY_CARE_PROVIDER_SITE_OTHER): Payer: BLUE CROSS/BLUE SHIELD | Admitting: Gynecology

## 2018-03-03 ENCOUNTER — Encounter: Payer: Self-pay | Admitting: Gynecology

## 2018-03-03 VITALS — BP 124/80 | Ht 60.0 in | Wt 187.0 lb

## 2018-03-03 DIAGNOSIS — M810 Age-related osteoporosis without current pathological fracture: Secondary | ICD-10-CM

## 2018-03-03 DIAGNOSIS — N952 Postmenopausal atrophic vaginitis: Secondary | ICD-10-CM

## 2018-03-03 DIAGNOSIS — Z01419 Encounter for gynecological examination (general) (routine) without abnormal findings: Secondary | ICD-10-CM | POA: Diagnosis not present

## 2018-03-03 MED ORDER — NYSTATIN-TRIAMCINOLONE 100000-0.1 UNIT/GM-% EX OINT: 1.0000 "application " | TOPICAL_OINTMENT | Freq: Two times a day (BID) | CUTANEOUS | 2 refills | Status: AC

## 2018-03-03 NOTE — Patient Instructions (Signed)
Follow-up in 1 year for annual exam, sooner if any issues. 

## 2018-03-03 NOTE — Progress Notes (Signed)
    Marrian SalvageCindy Oren April 26, 1956 [id number]        62 y.o.  G3P2011 for annual gynecologic exam.  Without gynecologic complaints  Past medical history,surgical history, problem list, medications, allergies, family history and social history were all reviewed and documented as reviewed in the EPIC chart.  ROS:  Performed with pertinent positives and negatives included in the history, assessment and plan.   Additional significant findings : None   Exam: Kennon PortelaKim Gardner assistant Vitals:   03/03/18 0827  BP: 124/80  Weight: 187 lb (84.8 kg)  Height: 5' (1.524 m)   Body mass index is 36.52 kg/m.  General appearance:  Normal affect, orientation and appearance. Skin: Grossly normal HEENT: Without gross lesions.  No cervical or supraclavicular adenopathy. Thyroid normal.  Lungs:  Clear without wheezing, rales or rhonchi Cardiac: RR, without RMG Abdominal:  Soft, nontender, without masses, guarding, rebound, organomegaly or hernia Breasts:  Examined lying and sitting without masses, retractions, discharge or axillary adenopathy. Pelvic:  Ext, BUS, Vagina: With atrophic changes  Cervix: With atrophic changes  Uterus: Difficult to palpate but no gross masses or tenderness  Adnexa: Without masses or tenderness    Anus and perineum: Normal   Rectovaginal: Normal sphincter tone without palpated masses or tenderness.    Assessment/Plan:  62 y.o. 993P2011 female for annual gynecologic exam.   1. Postmenopausal/atrophic genital changes.  No significant menopausal symptoms or any vaginal bleeding. 2. History of intermittent vulvar pruritus thought to be yeast.  Uses nystatin/triamcinolone cream intermittently with good results.  Refill 30 g x 2 provided. 3. Osteoporosis.  DEXA 01/2018 T score -2.7 distal third radius.  Overall stable from prior study 2015.  We discussed the options for treatment noting osteoporosis distal third of radius, osteopenia at other region of interest sites.  Osteoporosis of  the wrist does increase her risk of fracture at other sites although those sites remain osteopenic stable from prior studies.  The risks versus benefits of treatment reviewed and we both agreed to hold on treatment for now and follow-up study in 2 years.  Previous  vitamin D level 68.  Currently cannot take calcium supplements due to renal lithiasis. 4. Mammography scheduled tomorrow.  Breast exam normal today. 5. Colonoscopy overdue and I reminded her to schedule this and she agrees to do so. 6. Pap smear 2018.  No Pap smear done today.  Pap smear/HPV 2015 negative.  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  Plan follow-up Pap smear at 3-year interval per current screening guidelines. 7. Health maintenance.  No routine lab work done as patient does this elsewhere.  Follow-up 1 year, sooner as needed.   Dara Lordsimothy P Kross Swallows MD, 8:55 AM 03/03/2018

## 2018-03-04 ENCOUNTER — Encounter: Payer: Self-pay | Admitting: Gynecology

## 2018-03-04 DIAGNOSIS — Z1231 Encounter for screening mammogram for malignant neoplasm of breast: Secondary | ICD-10-CM | POA: Diagnosis not present

## 2018-03-08 DIAGNOSIS — N200 Calculus of kidney: Secondary | ICD-10-CM | POA: Diagnosis not present

## 2018-04-07 DIAGNOSIS — N200 Calculus of kidney: Secondary | ICD-10-CM | POA: Diagnosis not present

## 2018-04-21 DIAGNOSIS — E119 Type 2 diabetes mellitus without complications: Secondary | ICD-10-CM | POA: Diagnosis not present

## 2018-04-21 DIAGNOSIS — E785 Hyperlipidemia, unspecified: Secondary | ICD-10-CM | POA: Diagnosis not present

## 2018-04-21 DIAGNOSIS — K219 Gastro-esophageal reflux disease without esophagitis: Secondary | ICD-10-CM | POA: Diagnosis not present

## 2018-05-12 DIAGNOSIS — Z23 Encounter for immunization: Secondary | ICD-10-CM | POA: Diagnosis not present

## 2018-05-28 ENCOUNTER — Ambulatory Visit: Payer: BLUE CROSS/BLUE SHIELD | Admitting: Cardiovascular Disease

## 2018-07-07 DIAGNOSIS — N200 Calculus of kidney: Secondary | ICD-10-CM | POA: Diagnosis not present

## 2018-07-09 IMAGING — CT CT ABD-PELV W/ CM
2 of 5 series · 16 of 46 positions shown, 18 images · IV contrast (APPLIED)
Comparison: None.

CLINICAL DATA: Left-sided abdominal pain and nausea for 1 week.
Nephrolithiasis. Previous diverticulitis.

EXAM:
CT ABDOMEN AND PELVIS WITH CONTRAST
TECHNIQUE: Multidetector CT imaging of the abdomen and pelvis was performed
using the standard protocol following bolus administration of
intravenous contrast.
CONTRAST:  100mL 76JGZC-UFF IOPAMIDOL (76JGZC-UFF) INJECTION 61%

[Series 2: axial st · axial · 0.93mm/px · z∈[-695,-260]mm · 13 of 97 slices shown, 15 images]
[im 5/97  soft-tissue]
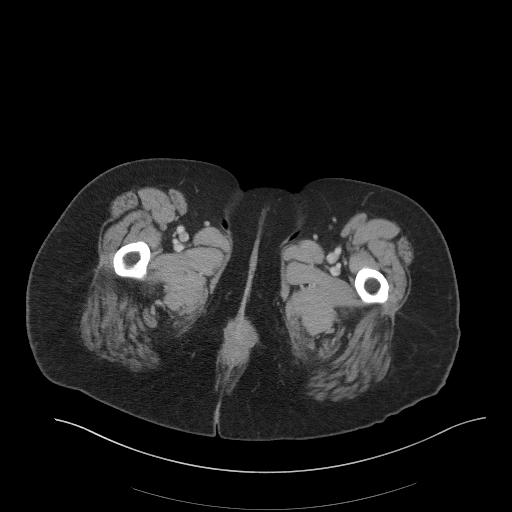
[im 5/97  bone]
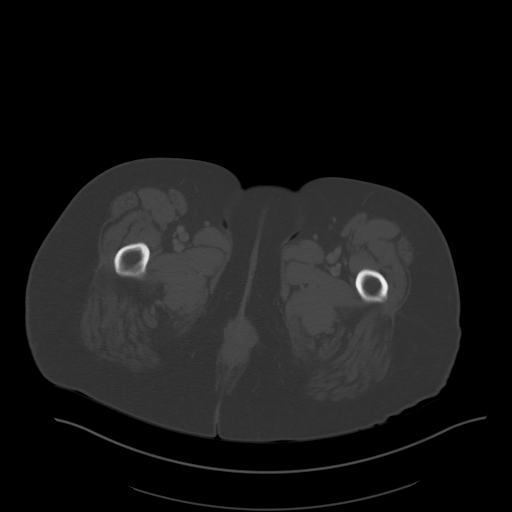
[im 15/97  soft-tissue]
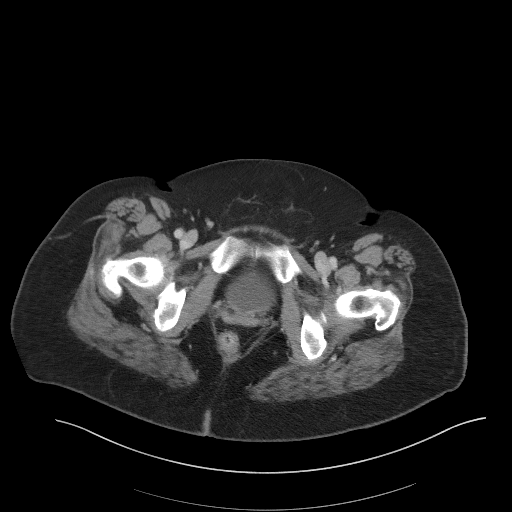
[im 20/97  soft-tissue]
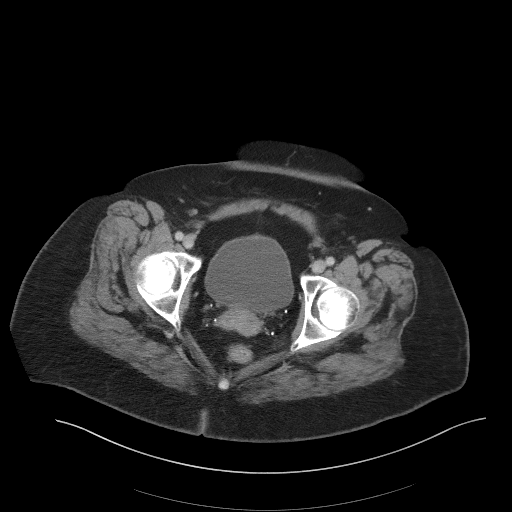
[im 29/97  soft-tissue]
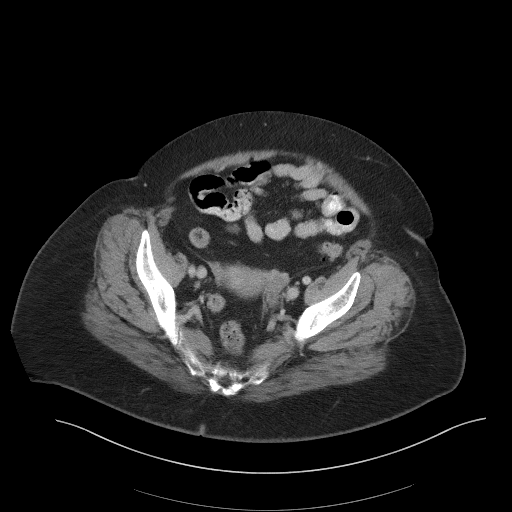
[im 34/97  soft-tissue]
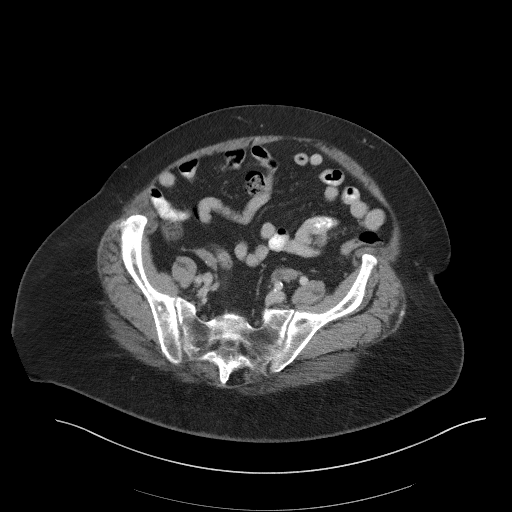
[im 44/97  soft-tissue]
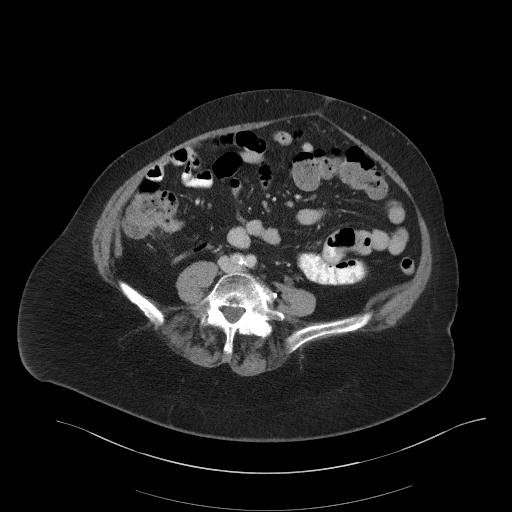
[im 49/97  soft-tissue]
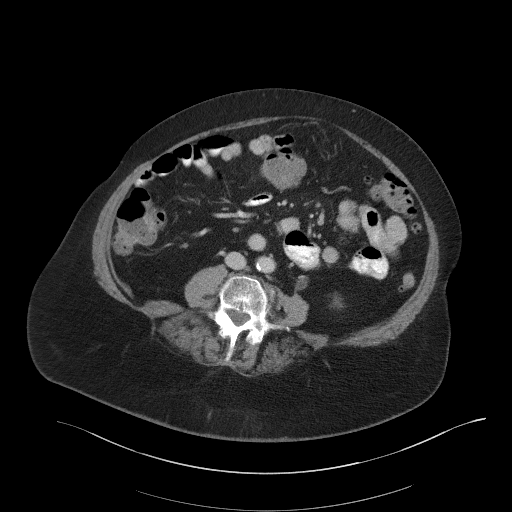
[im 53/97  soft-tissue]
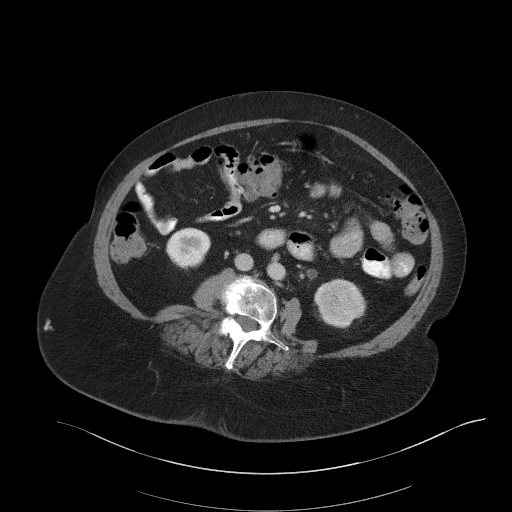
[im 63/97  soft-tissue]
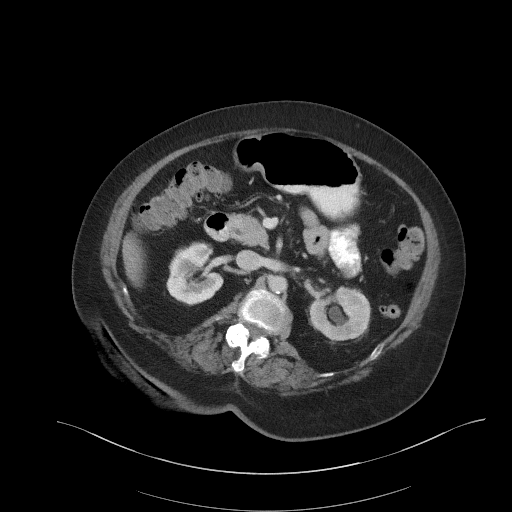
[im 63/97  bone]
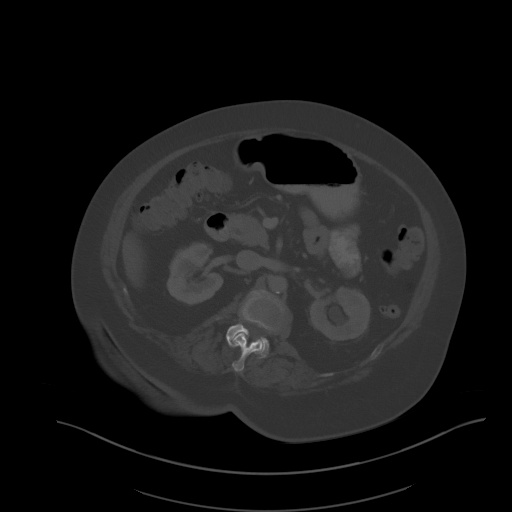
[im 68/97  soft-tissue]
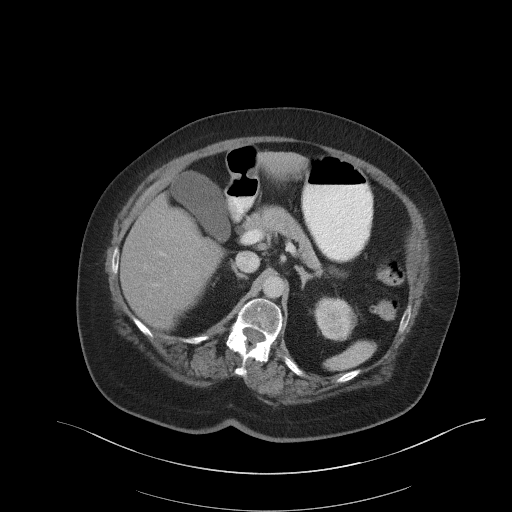
[im 77/97  soft-tissue]
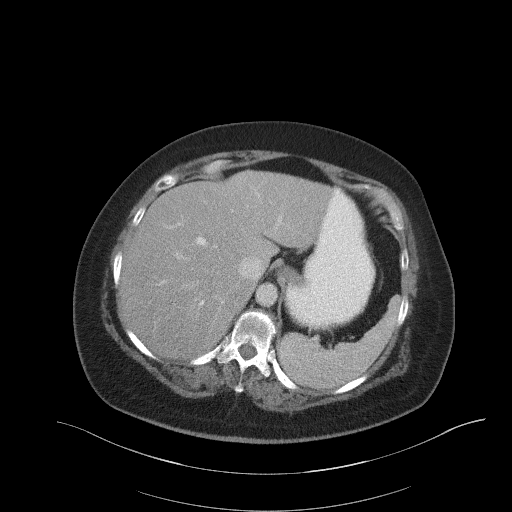
[im 82/97  soft-tissue]
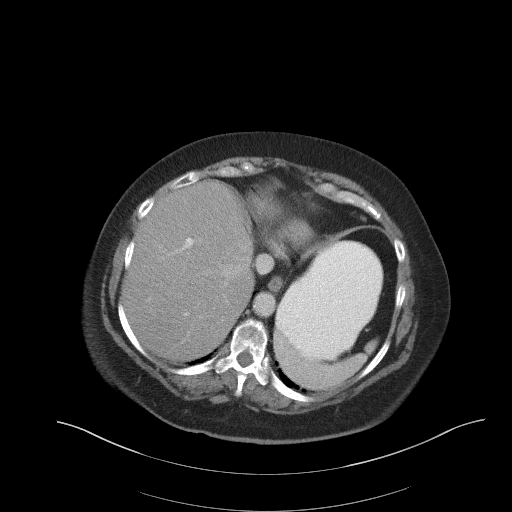
[im 92/97  soft-tissue]
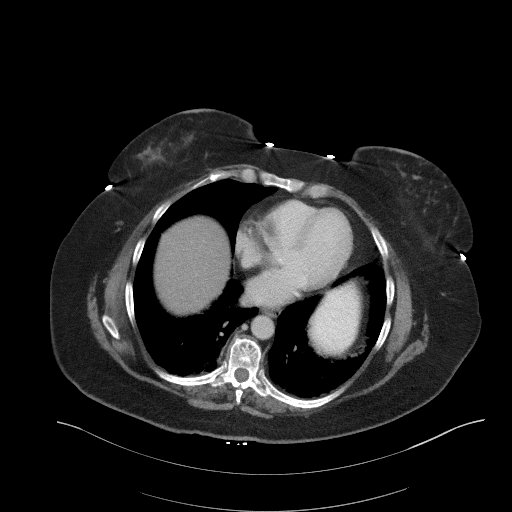

[Series 5: coronal st · coronal · 0.86mm/px · 3 of 106 slices shown]
[im 36/106  soft-tissue]
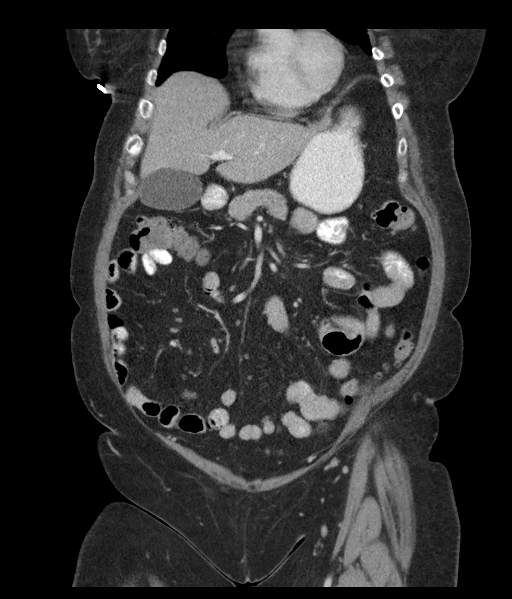
[im 47/106  soft-tissue]
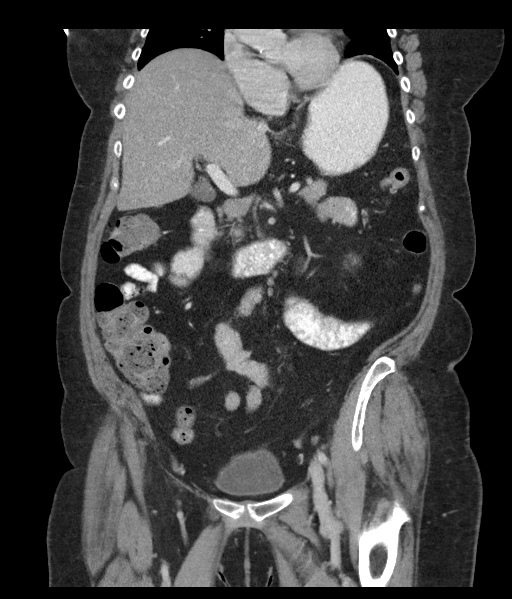
[im 59/106  soft-tissue]
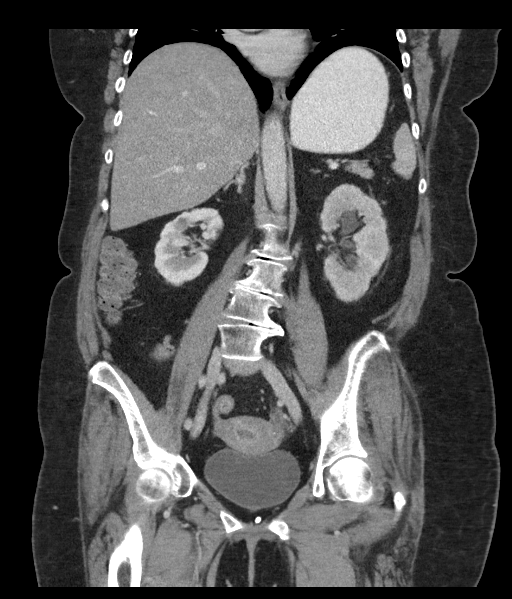

[16 of 46 positions shown; findings below may reference images not displayed]

FINDINGS: Lower Chest: No acute findings.

Hepatobiliary: Tiny low-attenuation lesion in the posterior right
hepatic lobe is too small to characterize but most likely represents
a tiny cyst. No definite liver masses are identified. Gallbladder is
unremarkable. No evidence of biliary ductal dilatation.

Pancreas:  No mass or inflammatory changes.

Spleen: Within normal limits in size and appearance.

Adrenals/Urinary Tract: No masses identified. Mild bilateral renal
parenchymal scarring. Tiny left renal cysts noted. Mild left
hydroureteronephrosis is seen due to a 5 mm calculus in the distal
left ureter. Unremarkable unopacified urinary bladder.

Stomach/Bowel: No evidence of obstruction, inflammatory process or
abnormal fluid collections. Diverticulosis is seen mainly involving
the descending and sigmoid colon, however there is no evidence of
diverticulitis. Normal appendix visualized.

Vascular/Lymphatic: No pathologically enlarged lymph nodes. No
abdominal aortic aneurysm. Aortic atherosclerosis.

Reproductive:  No mass or other significant abnormality.

Other:  None.

Musculoskeletal:  No suspicious bone lesions identified.
IMPRESSION: Mild left hydroureteronephrosis due to 5 mm distal left ureteral
calculus.

Colonic diverticulosis, without radiographic evidence of
diverticulitis.

## 2018-07-15 DIAGNOSIS — E119 Type 2 diabetes mellitus without complications: Secondary | ICD-10-CM | POA: Diagnosis not present

## 2018-07-15 DIAGNOSIS — E785 Hyperlipidemia, unspecified: Secondary | ICD-10-CM | POA: Diagnosis not present

## 2018-07-15 DIAGNOSIS — K219 Gastro-esophageal reflux disease without esophagitis: Secondary | ICD-10-CM | POA: Diagnosis not present

## 2018-07-23 DIAGNOSIS — E782 Mixed hyperlipidemia: Secondary | ICD-10-CM | POA: Diagnosis not present

## 2018-07-23 DIAGNOSIS — I10 Essential (primary) hypertension: Secondary | ICD-10-CM | POA: Diagnosis not present

## 2018-07-29 DIAGNOSIS — F4321 Adjustment disorder with depressed mood: Secondary | ICD-10-CM | POA: Diagnosis not present

## 2018-07-29 DIAGNOSIS — B351 Tinea unguium: Secondary | ICD-10-CM | POA: Diagnosis not present

## 2018-07-29 DIAGNOSIS — E1169 Type 2 diabetes mellitus with other specified complication: Secondary | ICD-10-CM | POA: Diagnosis not present

## 2018-07-29 DIAGNOSIS — I10 Essential (primary) hypertension: Secondary | ICD-10-CM | POA: Diagnosis not present

## 2018-07-29 DIAGNOSIS — Z0000 Encounter for general adult medical examination without abnormal findings: Secondary | ICD-10-CM | POA: Diagnosis not present

## 2018-08-03 ENCOUNTER — Ambulatory Visit: Payer: BLUE CROSS/BLUE SHIELD | Admitting: Cardiovascular Disease

## 2018-08-04 DIAGNOSIS — E119 Type 2 diabetes mellitus without complications: Secondary | ICD-10-CM | POA: Diagnosis not present

## 2018-08-04 DIAGNOSIS — H2513 Age-related nuclear cataract, bilateral: Secondary | ICD-10-CM | POA: Diagnosis not present

## 2018-08-27 ENCOUNTER — Ambulatory Visit: Payer: BLUE CROSS/BLUE SHIELD | Admitting: Cardiovascular Disease

## 2018-08-27 ENCOUNTER — Encounter (INDEPENDENT_AMBULATORY_CARE_PROVIDER_SITE_OTHER): Payer: Self-pay

## 2018-08-27 ENCOUNTER — Encounter: Payer: Self-pay | Admitting: Cardiovascular Disease

## 2018-08-27 VITALS — BP 138/84 | HR 82 | Ht 61.0 in | Wt 184.4 lb

## 2018-08-27 DIAGNOSIS — I10 Essential (primary) hypertension: Secondary | ICD-10-CM | POA: Diagnosis not present

## 2018-08-27 DIAGNOSIS — E782 Mixed hyperlipidemia: Secondary | ICD-10-CM | POA: Diagnosis not present

## 2018-08-27 NOTE — Assessment & Plan Note (Signed)
History of hyperlipidemia on pravastatin 40 mg a day with recent lipid profile performed 07/23/2018 revealing cholesterol 198, LDL 131 and HDL of 40.  This is being followed by her PCP.  We talked about the importance of LDL less than 100 for primary prevention.

## 2018-08-27 NOTE — Progress Notes (Signed)
08/27/2018 Crystal Andrews   May 07, 1956  [id number]  Primary Physician Daisy Florooss, Charles Alan, MD Primary Cardiologist: Runell GessJonathan J Jodeci Roarty MD Nicholes CalamityFACP, FACC, FAHA, [place]  HPI:  Crystal Andrews is a 63 y.o.  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 I last saw her in the office  01/21/2017. I have been seeing her for diastolic dysfunction with risk factors that include diabetes, hypertension, and hyperlipidemia. She has normal LV systolic function with a negative Myoview. She has obstructive sleep apnea, on CPAP.she was intolerant to Crestor was placed back on statin 40 mg by Dr. Tenny Crawoss.  Lipid profile performed 07/23/2018 revealed total after 198, LDL of 131 and HDL of 40.  She denies chest pain or shortness of breath.   Current Meds  Medication Sig  . ALPRAZolam (XANAX) 0.5 MG tablet Take 0.5 mg by mouth at bedtime as needed for anxiety.  Marland Kitchen. aspirin 81 MG tablet Take 81 mg by mouth daily.  . candesartan (ATACAND) 4 MG tablet Take 4 mg by mouth daily.  . Cinnamon 500 MG capsule Take 1,000 mg by mouth daily.  . Coenzyme Q10 (CO Q-10) 200 MG CAPS Take 1 capsule by mouth daily.  Marland Kitchen. escitalopram (LEXAPRO) 20 MG tablet Take 20 mg by mouth every other day.   . ibuprofen (ADVIL,MOTRIN) 200 MG tablet Take 200 mg by mouth every 6 (six) hours as needed. Pain    . metFORMIN (GLUCOPHAGE-XR) 500 MG 24 hr tablet Take 500 mg by mouth 2 (two) times daily.    Marland Kitchen. nystatin-triamcinolone ointment (MYCOLOG) Apply 1 application topically 2 (two) times daily.  Marland Kitchen. omeprazole (PRILOSEC) 20 MG capsule Take 20 mg by mouth every other day.   . pravastatin (PRAVACHOL) 40 MG tablet Take 40 mg by mouth daily.  . [DISCONTINUED] Cholecalciferol (VITAMIN D-3 PO) Take by mouth daily. Reported on 01/22/2016  . [DISCONTINUED] OVER THE COUNTER MEDICATION daily. Estroven  . [DISCONTINUED] tamsulosin (FLOMAX) 0.4 MG CAPS  capsule Take 1 capsule (0.4 mg total) by mouth daily.     Allergies  Allergen Reactions  . Cephalexin Itching  . Crestor [Rosuvastatin Calcium] Other (See Comments)    Joint pain  . Sulfa Antibiotics Nausea Only    Social History   Socioeconomic History  . Marital status: Married    Spouse name: Not on file  . Number of children: Not on file  . Years of education: Not on file  . Highest education level: Not on file  Occupational History  . Not on file  Social Needs  . Financial resource strain: Not on file  . Food insecurity:    Worry: Not on file    Inability: Not on file  . Transportation needs:    Medical: Not on file    Non-medical: Not on file  Tobacco Use  . Smoking status: Never Smoker  . Smokeless tobacco: Never Used  Substance and Sexual Activity  . Alcohol use: No    Alcohol/week: 0.0 standard drinks  . Drug use: No  . Sexual activity: Not Currently    Birth control/protection: Post-menopausal    Comment: 1st intercourse 63 yo-Fewer than 5 partners  Lifestyle  . Physical activity:    Days per week: Not on file    Minutes per session: Not on file  . Stress: Not on file  Relationships  . Social connections:    Talks on  phone: Not on file    Gets together: Not on file    Attends religious service: Not on file    Active member of club or organization: Not on file    Attends meetings of clubs or organizations: Not on file    Relationship status: Not on file  . Intimate partner violence:    Fear of current or ex partner: Not on file    Emotionally abused: Not on file    Physically abused: Not on file    Forced sexual activity: Not on file  Other Topics Concern  . Not on file  Social History Narrative  . Not on file     Review of Systems: General: negative for chills, fever, night sweats or weight changes.  Cardiovascular: negative for chest pain, dyspnea on exertion, edema, orthopnea, palpitations, paroxysmal nocturnal dyspnea or shortness of  breath Dermatological: negative for rash Respiratory: negative for cough or wheezing Urologic: negative for hematuria Abdominal: negative for nausea, vomiting, diarrhea, bright red blood per rectum, melena, or hematemesis Neurologic: negative for visual changes, syncope, or dizziness All other systems reviewed and are otherwise negative except as noted above.    Blood pressure 138/84, pulse 82, height 5\' 1"  (1.549 m), weight 184 lb 6.4 oz (83.6 kg).  General appearance: alert and no distress Neck: no adenopathy, no carotid bruit, no JVD, supple, symmetrical, trachea midline and thyroid not enlarged, symmetric, no tenderness/mass/nodules Lungs: clear to auscultation bilaterally Heart: regular rate and rhythm, S1, S2 normal, no murmur, click, rub or gallop Extremities: extremities normal, atraumatic, no cyanosis or edema Pulses: 2+ and symmetric Skin: Skin color, texture, turgor normal. No rashes or lesions Neurologic: Alert and oriented X 3, normal strength and tone. Normal symmetric reflexes. Normal coordination and gait  EKG sinus rhythm 82 without ST or T wave changes. I  Personally reviewed this EKG.  ASSESSMENT AND PLAN:   Hyperlipemia History of hyperlipidemia on pravastatin 40 mg a day with recent lipid profile performed 07/23/2018 revealing cholesterol 198, LDL 131 and HDL of 40.  This is being followed by her PCP.  We talked about the importance of LDL less than 100 for primary prevention.  Hypertension History of essential hypertension blood pressure measured today at 138/84.  She is on Atacand.  Continue current meds at current dosing.      Runell Gess MD FACP,FACC,FAHA, [facility] 08/27/2018 9:56 AM

## 2018-08-27 NOTE — Patient Instructions (Signed)
Medication Instructions:  NONE If you need a refill on your cardiac medications before your next appointment, please call your pharmacy.   Lab work: NONE If you have labs (blood work) drawn today and your tests are completely normal, you will receive your results only by: . MyChart Message (if you have MyChart) OR . A paper copy in the mail If you have any lab test that is abnormal or we need to change your treatment, we will call you to review the results.  Testing/Procedures: NONE  Follow-Up: At [facility], you and your health needs are our priority.  As part of our continuing mission to provide you with exceptional heart care, we have created designated Provider Care Teams.  These Care Teams include your primary Cardiologist (physician) and Advanced Practice Providers (APPs -  Physician Assistants and Nurse Practitioners) who all work together to provide you with the care you need, when you need it. . You will need a follow up appointment in 12 months.  Please call our office 2 months in advance to schedule this appointment.  You may see Dr. Berry or one of the following Advanced Practice Providers on your designated Care Team:   . Luke Kilroy, PA-C . Hao Meng, PA-C . Angela Duke, PA-C . Kathryn Lawrence, DNP . Rhonda Barrett, PA-C . Krista Kroeger, PA-C . Callie Goodrich, PA-C   

## 2018-08-27 NOTE — Assessment & Plan Note (Signed)
History of essential hypertension blood pressure measured today at 138/84.  She is on Atacand.  Continue current meds at current dosing.

## 2018-09-06 DIAGNOSIS — L718 Other rosacea: Secondary | ICD-10-CM | POA: Diagnosis not present

## 2018-09-06 DIAGNOSIS — D2262 Melanocytic nevi of left upper limb, including shoulder: Secondary | ICD-10-CM | POA: Diagnosis not present

## 2018-09-06 DIAGNOSIS — L814 Other melanin hyperpigmentation: Secondary | ICD-10-CM | POA: Diagnosis not present

## 2018-09-06 DIAGNOSIS — L853 Xerosis cutis: Secondary | ICD-10-CM | POA: Diagnosis not present

## 2018-12-14 DIAGNOSIS — L718 Other rosacea: Secondary | ICD-10-CM | POA: Diagnosis not present

## 2018-12-14 DIAGNOSIS — L821 Other seborrheic keratosis: Secondary | ICD-10-CM | POA: Diagnosis not present

## 2019-01-24 DIAGNOSIS — L718 Other rosacea: Secondary | ICD-10-CM | POA: Diagnosis not present

## 2019-02-07 DIAGNOSIS — F4321 Adjustment disorder with depressed mood: Secondary | ICD-10-CM | POA: Diagnosis not present

## 2019-02-07 DIAGNOSIS — R110 Nausea: Secondary | ICD-10-CM | POA: Diagnosis not present

## 2019-02-07 DIAGNOSIS — E1169 Type 2 diabetes mellitus with other specified complication: Secondary | ICD-10-CM | POA: Diagnosis not present

## 2019-03-01 DIAGNOSIS — Z01818 Encounter for other preprocedural examination: Secondary | ICD-10-CM | POA: Diagnosis not present

## 2019-03-07 ENCOUNTER — Other Ambulatory Visit: Payer: Self-pay

## 2019-03-08 ENCOUNTER — Encounter: Payer: Self-pay | Admitting: Gynecology

## 2019-03-08 ENCOUNTER — Ambulatory Visit (INDEPENDENT_AMBULATORY_CARE_PROVIDER_SITE_OTHER): Payer: BC Managed Care – PPO | Admitting: Gynecology

## 2019-03-08 VITALS — BP 124/80 | Ht 61.0 in | Wt 184.0 lb

## 2019-03-08 DIAGNOSIS — Z01419 Encounter for gynecological examination (general) (routine) without abnormal findings: Secondary | ICD-10-CM | POA: Diagnosis not present

## 2019-03-08 DIAGNOSIS — N952 Postmenopausal atrophic vaginitis: Secondary | ICD-10-CM

## 2019-03-08 DIAGNOSIS — Z1151 Encounter for screening for human papillomavirus (HPV): Secondary | ICD-10-CM | POA: Diagnosis not present

## 2019-03-08 DIAGNOSIS — M810 Age-related osteoporosis without current pathological fracture: Secondary | ICD-10-CM

## 2019-03-08 NOTE — Patient Instructions (Addendum)
Schedule your colonoscopy.  Follow-up 1 year for annual exam, sooner if any issues.

## 2019-03-08 NOTE — Addendum Note (Signed)
Addended by: Nelva Nay on: 03/08/2019 08:49 AM   Modules accepted: Orders

## 2019-03-08 NOTE — Progress Notes (Signed)
    Fraida Veldman September 13, 1955 [id number]        63 y.o.  G3P2011 for annual gynecologic exam.  Without gynecologic complaints  Past medical history,surgical history, problem list, medications, allergies, family history and social history were all reviewed and documented as reviewed in the EPIC chart.  ROS:  Performed with pertinent positives and negatives included in the history, assessment and plan.   Additional significant findings : None   Exam: Caryn Bee assistant Vitals:   03/08/19 0816  BP: 124/80  Weight: 184 lb (83.5 kg)  Height: 5\' 1"  (1.549 m)   Body mass index is 34.77 kg/m.  General appearance:  Normal affect, orientation and appearance. Skin: Grossly normal HEENT: Without gross lesions.  No cervical or supraclavicular adenopathy. Thyroid normal.  Lungs:  Clear without wheezing, rales or rhonchi Cardiac: RR, without RMG Abdominal:  Soft, nontender, without masses, guarding, rebound, organomegaly or hernia Breasts:  Examined lying and sitting without masses, retractions, discharge or axillary adenopathy. Pelvic:  Ext, BUS, Vagina: With atrophic changes  Cervix: With atrophic changes.  Pap smear/HPV  Uterus: Difficult to palpate.  No gross masses or tenderness  Adnexa: Without masses or tenderness    Anus and perineum: Normal   Rectovaginal: Normal sphincter tone without palpated masses or tenderness.    Assessment/Plan:  63 y.o. G53P2011 female for annual gynecologic exam.   1. Postmenopausal.  No significant menopausal symptoms or any vaginal bleeding. 2. Osteoporosis.  DEXA 2019 T score -2.7 distal third of radius.  Osteopenia at other measured sites.  We discussed treatment options previously and she elected not to be treated.  We will plan follow-up DEXA next year at 2-year interval. 3. Pap smear 2018.  Pap smear/HPV today.  History of CIS 1989 with normal Pap smears since. 4. Mammography scheduled tomorrow.  Breast exam normal today. 5. Colonoscopy over 10  years ago.  Reminded patient to schedule. 6. Health maintenance.  No routine lab work done as patient does this elsewhere.  Follow-up in 1 year, sooner as needed.   Anastasio Auerbach MD, 8:40 AM 03/08/2019

## 2019-03-09 ENCOUNTER — Encounter: Payer: Self-pay | Admitting: Gynecology

## 2019-03-09 DIAGNOSIS — Z1231 Encounter for screening mammogram for malignant neoplasm of breast: Secondary | ICD-10-CM | POA: Diagnosis not present

## 2019-03-09 LAB — PAP IG AND HPV HIGH-RISK: HPV DNA High Risk: NOT DETECTED

## 2019-04-19 DIAGNOSIS — Z23 Encounter for immunization: Secondary | ICD-10-CM | POA: Diagnosis not present

## 2019-04-20 DIAGNOSIS — Z1159 Encounter for screening for other viral diseases: Secondary | ICD-10-CM | POA: Diagnosis not present

## 2019-04-25 DIAGNOSIS — Z1211 Encounter for screening for malignant neoplasm of colon: Secondary | ICD-10-CM | POA: Diagnosis not present

## 2019-04-25 DIAGNOSIS — K5730 Diverticulosis of large intestine without perforation or abscess without bleeding: Secondary | ICD-10-CM | POA: Diagnosis not present

## 2019-04-26 ENCOUNTER — Encounter: Payer: Self-pay | Admitting: Gynecology

## 2019-05-18 DIAGNOSIS — L718 Other rosacea: Secondary | ICD-10-CM | POA: Diagnosis not present

## 2019-06-17 DIAGNOSIS — Z23 Encounter for immunization: Secondary | ICD-10-CM | POA: Diagnosis not present

## 2019-08-03 DIAGNOSIS — Z23 Encounter for immunization: Secondary | ICD-10-CM | POA: Diagnosis not present

## 2019-08-03 DIAGNOSIS — E1169 Type 2 diabetes mellitus with other specified complication: Secondary | ICD-10-CM | POA: Diagnosis not present

## 2019-08-03 DIAGNOSIS — E782 Mixed hyperlipidemia: Secondary | ICD-10-CM | POA: Diagnosis not present

## 2019-08-03 DIAGNOSIS — F4320 Adjustment disorder, unspecified: Secondary | ICD-10-CM | POA: Diagnosis not present

## 2019-08-03 DIAGNOSIS — Z0000 Encounter for general adult medical examination without abnormal findings: Secondary | ICD-10-CM | POA: Diagnosis not present

## 2019-08-03 DIAGNOSIS — I10 Essential (primary) hypertension: Secondary | ICD-10-CM | POA: Diagnosis not present

## 2019-08-08 DIAGNOSIS — H2513 Age-related nuclear cataract, bilateral: Secondary | ICD-10-CM | POA: Diagnosis not present

## 2019-08-08 DIAGNOSIS — E119 Type 2 diabetes mellitus without complications: Secondary | ICD-10-CM | POA: Diagnosis not present

## 2019-08-15 DIAGNOSIS — Z23 Encounter for immunization: Secondary | ICD-10-CM | POA: Diagnosis not present

## 2019-10-15 DIAGNOSIS — Z23 Encounter for immunization: Secondary | ICD-10-CM | POA: Diagnosis not present

## 2019-10-21 DIAGNOSIS — D1801 Hemangioma of skin and subcutaneous tissue: Secondary | ICD-10-CM | POA: Diagnosis not present

## 2019-10-21 DIAGNOSIS — L853 Xerosis cutis: Secondary | ICD-10-CM | POA: Diagnosis not present

## 2019-10-21 DIAGNOSIS — L718 Other rosacea: Secondary | ICD-10-CM | POA: Diagnosis not present

## 2019-10-21 DIAGNOSIS — L570 Actinic keratosis: Secondary | ICD-10-CM | POA: Diagnosis not present

## 2019-10-21 DIAGNOSIS — L858 Other specified epidermal thickening: Secondary | ICD-10-CM | POA: Diagnosis not present

## 2019-11-12 DIAGNOSIS — Z23 Encounter for immunization: Secondary | ICD-10-CM | POA: Diagnosis not present

## 2020-02-08 DIAGNOSIS — E1169 Type 2 diabetes mellitus with other specified complication: Secondary | ICD-10-CM | POA: Diagnosis not present

## 2020-02-08 DIAGNOSIS — F4329 Adjustment disorder with other symptoms: Secondary | ICD-10-CM | POA: Diagnosis not present

## 2020-03-08 ENCOUNTER — Encounter: Payer: BC Managed Care – PPO | Admitting: Obstetrics and Gynecology

## 2020-03-09 DIAGNOSIS — Z1231 Encounter for screening mammogram for malignant neoplasm of breast: Secondary | ICD-10-CM | POA: Diagnosis not present

## 2020-03-20 ENCOUNTER — Other Ambulatory Visit: Payer: Self-pay

## 2020-03-20 ENCOUNTER — Ambulatory Visit (INDEPENDENT_AMBULATORY_CARE_PROVIDER_SITE_OTHER): Payer: BC Managed Care – PPO | Admitting: Cardiovascular Disease

## 2020-03-20 ENCOUNTER — Encounter: Payer: Self-pay | Admitting: Cardiovascular Disease

## 2020-03-20 VITALS — BP 114/84 | HR 83 | Ht 60.0 in | Wt 180.8 lb

## 2020-03-20 DIAGNOSIS — I10 Essential (primary) hypertension: Secondary | ICD-10-CM

## 2020-03-20 DIAGNOSIS — G4733 Obstructive sleep apnea (adult) (pediatric): Secondary | ICD-10-CM | POA: Diagnosis not present

## 2020-03-20 DIAGNOSIS — E782 Mixed hyperlipidemia: Secondary | ICD-10-CM | POA: Diagnosis not present

## 2020-03-20 NOTE — Assessment & Plan Note (Signed)
Patient essential potential blood pressure measured today 114/84.  She is on Atacand.

## 2020-03-20 NOTE — Patient Instructions (Signed)
Medication Instructions:  Your physician recommends that you continue on your current medications as directed. Please refer to the Current Medication list given to you today.  *If you need a refill on your cardiac medications before your next appointment, please call your pharmacy  Follow-Up: At [facility], you and your health needs are our priority.  As part of our continuing mission to provide you with exceptional heart care, we have created designated Provider Care Teams.  These Care Teams include your primary Cardiologist (physician) and Advanced Practice Providers (APPs -  Physician Assistants and Nurse Practitioners) who all work together to provide you with the care you need, when you need it.  We recommend signing up for the patient portal called "MyChart".  Sign up information is provided on this After Visit Summary.  MyChart is used to connect with patients for Virtual Visits (Telemedicine).  Patients are able to view lab/test results, encounter notes, upcoming appointments, etc.  Non-urgent messages can be sent to your provider as well.   To learn more about what you can do with MyChart, go to [email or web address].    Your next appointment:   12 month(s)  The format for your next appointment:   In Person  Provider:   Nanetta Batty, MD   Other Instructions Our sleep coordinator will reach out to discuss what is needed to get you back on CPAP.

## 2020-03-20 NOTE — Progress Notes (Addendum)
04/27/2020 Crystal Andrews   08-07-1955  [id number]  Primary Physician Daisy Floro, MD Primary Cardiologist: Runell Gess MD Nicholes Calamity, [place]  HPI:  Crystal Andrews is a 64 y.o.  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 I last saw her in the office  08/27/2018. I have been seeing her for diastolic dysfunction with risk factors that include diabetes, hypertension, and hyperlipidemia. She has normal LV systolic function with a negative Myoview. She has obstructive sleep apnea, on CPAP.she was intolerant to Crestor was placed back on statin 40 mg by Dr. Tenny Craw.   Since I saw her back over a year and a half ago she continues to do well.  She apparently is going to retire this coming September.  She denies chest pain or shortness of breath.  She unfortunately has not been using her CPAP and wishes to get back on it.  Due to continued obstructive sleep apnea symptoms I recommend that she restart CPAP therapy.  Per insurance guidelines a new sleep study will need to be ordered.   Current Meds  Medication Sig  . ALPRAZolam (XANAX) 0.5 MG tablet Take 0.5 mg by mouth at bedtime as needed for anxiety.  Marland Kitchen aspirin 81 MG tablet Take 81 mg by mouth daily.  . candesartan (ATACAND) 4 MG tablet Take 4 mg by mouth daily.  . Cinnamon 500 MG capsule Take 1,000 mg by mouth daily.  . Coenzyme Q10 (CO Q-10) 200 MG CAPS Take 1 capsule by mouth daily.  Marland Kitchen escitalopram (LEXAPRO) 20 MG tablet Take 20 mg by mouth every other day.   . ibuprofen (ADVIL,MOTRIN) 200 MG tablet Take 200 mg by mouth every 6 (six) hours as needed. Pain    . nystatin-triamcinolone ointment (MYCOLOG) Apply 1 application topically 2 (two) times daily.  Marland Kitchen omeprazole (PRILOSEC) 20 MG capsule Take 20 mg by mouth every other day.   . pravastatin (PRAVACHOL) 40 MG tablet Take 40 mg by mouth daily.  .  [DISCONTINUED] metFORMIN (GLUCOPHAGE-XR) 500 MG 24 hr tablet Take 500 mg by mouth 2 (two) times daily.       Allergies  Allergen Reactions  . Cephalexin Itching  . Crestor [Rosuvastatin Calcium] Other (See Comments)    Joint pain  . Sulfa Antibiotics Nausea Only    Social History   Socioeconomic History  . Marital status: Married    Spouse name: Not on file  . Number of children: Not on file  . Years of education: Not on file  . Highest education level: Not on file  Occupational History  . Not on file  Tobacco Use  . Smoking status: Never Smoker  . Smokeless tobacco: Never Used  Vaping Use  . Vaping Use: Never used  Substance and Sexual Activity  . Alcohol use: No    Alcohol/week: 0.0 standard drinks  . Drug use: No  . Sexual activity: Not Currently    Birth control/protection: Post-menopausal    Comment: 1st intercourse 64 yo-Fewer than 5 partners  Other Topics Concern  . Not on file  Social History Narrative  . Not on file   Social Determinants of Health   Financial Resource Strain:   . Difficulty of Paying Living Expenses: Not on file  Food Insecurity:   . Worried About Programme researcher, broadcasting/film/video in the Last Year: Not on file  . Ran  Out of Food in the Last Year: Not on file  Transportation Needs:   . Lack of Transportation (Medical): Not on file  . Lack of Transportation (Non-Medical): Not on file  Physical Activity:   . Days of Exercise per Week: Not on file  . Minutes of Exercise per Session: Not on file  Stress:   . Feeling of Stress : Not on file  Social Connections:   . Frequency of Communication with Friends and Family: Not on file  . Frequency of Social Gatherings with Friends and Family: Not on file  . Attends Religious Services: Not on file  . Active Member of Clubs or Organizations: Not on file  . Attends Banker Meetings: Not on file  . Marital Status: Not on file  Intimate Partner Violence:   . Fear of Current or Ex-Partner: Not on  file  . Emotionally Abused: Not on file  . Physically Abused: Not on file  . Sexually Abused: Not on file     Review of Systems: General: negative for chills, fever, night sweats or weight changes.  Cardiovascular: negative for chest pain, dyspnea on exertion, edema, orthopnea, palpitations, paroxysmal nocturnal dyspnea or shortness of breath Dermatological: negative for rash Respiratory: negative for cough or wheezing Urologic: negative for hematuria Abdominal: negative for nausea, vomiting, diarrhea, bright red blood per rectum, melena, or hematemesis Neurologic: negative for visual changes, syncope, or dizziness All other systems reviewed and are otherwise negative except as noted above.    Blood pressure 114/84, pulse 83, height 5' (1.524 m), weight 180 lb 12.8 oz (82 kg).  General appearance: alert and no distress Neck: no adenopathy, no carotid bruit, no JVD, supple, symmetrical, trachea midline and thyroid not enlarged, symmetric, no tenderness/mass/nodules Lungs: clear to auscultation bilaterally Heart: regular rate and rhythm, S1, S2 normal, no murmur, click, rub or gallop Extremities: extremities normal, atraumatic, no cyanosis or edema Pulses: 2+ and symmetric Skin: Skin color, texture, turgor normal. No rashes or lesions Neurologic: Alert and oriented X 3, normal strength and tone. Normal symmetric reflexes. Normal coordination and gait  EKG sinus rhythm 83 with small inferior Q waves.  Personally reviewed this EKG.  ASSESSMENT AND PLAN:        Runell Gess MD [facility], [facility] 04/27/2020 12:23 PM

## 2020-03-20 NOTE — Assessment & Plan Note (Addendum)
History of obstructive sleep apnea on CPAP in the past.  Due to continued sleep apnea symptoms, I recommend that she restart CPAP therapy.  Per insurance guidelines a new CPAP study will need to be ordered.

## 2020-03-20 NOTE — Assessment & Plan Note (Signed)
History of hyperlipidemia on Pravachol with lipid profile performed 08/03/2019 revealing total cholesterol 178, LDL 105 and HDL 46, acceptable for primary prevention.

## 2020-03-21 ENCOUNTER — Telehealth: Payer: Self-pay | Admitting: *Deleted

## 2020-03-21 NOTE — Telephone Encounter (Signed)
-----   Message from Harvel Ricks, RN sent at 03/20/2020 10:17 AM EDT ----- Regarding: RE: Needs to restart CPAP Okay, I will let him know.   Will you discuss with the patient please? ----- Message ----- From: Gaynelle Cage, CMA Sent: 03/20/2020  10:12 AM EDT To: Harvel Ricks, RN Subject: RE: Needs to restart CPAP                      He needs to note in his dictation that she has been off of it for whatever reason and needs to restart. She has to start all over with new sleep study. It can be a HST then we can do APAP. ----- Message ----- From: Harvel Ricks, RN Sent: 03/20/2020   9:01 AM EDT To: Gaynelle Cage, CMA Subject: Needs to restart CPAP                          Patient saw Dr. Allyson Sabal this morning. He has requested you review chart and reach out to patient to discuss what is needed to get patient back on CPAP therapy.    Thanks!

## 2020-03-21 NOTE — Telephone Encounter (Signed)
Called patient per Dr Hazle Coca request to see what we need to do to get patient restarted on her CPAP therapy. Patient states that she has not used her CPAP machine in 1.5 to 2 years. I explained to her she will need to start over from the beginning with a new sleep study. Patient agrees to do this however, she is getting ready to switch to her husbands [facility] plan in October until she changes over to [facility] in February 2022. She will call me back in October when she is on her husbands insurance.

## 2020-03-22 ENCOUNTER — Other Ambulatory Visit: Payer: Self-pay

## 2020-03-22 ENCOUNTER — Encounter: Payer: Self-pay | Admitting: Obstetrics and Gynecology

## 2020-03-22 ENCOUNTER — Ambulatory Visit: Payer: BC Managed Care – PPO | Admitting: Obstetrics and Gynecology

## 2020-03-22 VITALS — BP 124/80 | Ht 61.0 in | Wt 178.0 lb

## 2020-03-22 DIAGNOSIS — M810 Age-related osteoporosis without current pathological fracture: Secondary | ICD-10-CM | POA: Diagnosis not present

## 2020-03-22 DIAGNOSIS — Z01419 Encounter for gynecological examination (general) (routine) without abnormal findings: Secondary | ICD-10-CM

## 2020-03-22 NOTE — Progress Notes (Signed)
Mima Cranmore 09-30-1955 [id number]  SUBJECTIVE:  64 y.o. G3P2011 female for annual routine gynecologic exam. She has no gynecologic concerns.  Current Outpatient Medications  Medication Sig Dispense Refill  . ALPRAZolam (XANAX) 0.5 MG tablet Take 0.5 mg by mouth at bedtime as needed for anxiety.    Marland Kitchen aspirin 81 MG tablet Take 81 mg by mouth daily.    . candesartan (ATACAND) 4 MG tablet Take 4 mg by mouth daily.    . Cinnamon 500 MG capsule Take 1,000 mg by mouth daily.    . Coenzyme Q10 (CO Q-10) 200 MG CAPS Take 1 capsule by mouth daily.    Marland Kitchen doxycycline (VIBRAMYCIN) 50 MG capsule Take 50 mg by mouth daily.    Marland Kitchen escitalopram (LEXAPRO) 20 MG tablet Take 20 mg by mouth every other day.     . ibuprofen (ADVIL,MOTRIN) 200 MG tablet Take 200 mg by mouth every 6 (six) hours as needed. Pain      . metFORMIN (GLUCOPHAGE) 1000 MG tablet Take 1,000 mg by mouth daily.     Marland Kitchen METRONIDAZOLE, TOPICAL, 0.75 % LOTN Apply 1 application topically 2 (two) times daily.    Marland Kitchen nystatin-triamcinolone ointment (MYCOLOG) Apply 1 application topically 2 (two) times daily. 30 g 2  . omeprazole (PRILOSEC) 20 MG capsule Take 20 mg by mouth every other day.     . pravastatin (PRAVACHOL) 40 MG tablet Take 40 mg by mouth daily.  2   No current facility-administered medications for this visit.   Allergies: Cephalexin, Crestor [rosuvastatin calcium], and Sulfa antibiotics  No LMP recorded. Patient is postmenopausal.  Past medical history,surgical history, problem list, medications, allergies, family history and social history were all reviewed and documented as reviewed in the EPIC chart.  ROS:  Feeling well. No dyspnea or chest pain on exertion.  No abdominal pain, change in bowel habits, black or bloody stools.  No urinary tract symptoms. GYN ROS: no abnormal bleeding, pelvic pain or discharge, no breast pain or new or enlarging lumps on self exam.No neurological complaints.   OBJECTIVE:  BP 124/80   Ht 5\' 1"   (1.549 m)   Wt 178 lb (80.7 kg)   BMI 33.63 kg/m  The patient appears well, alert, oriented x 3, in no distress. ENT normal.  Neck supple. No cervical or supraclavicular adenopathy or thyromegaly.  Lungs are clear, good air entry, no wheezes, rhonchi or rales. S1 and S2 normal, no murmurs, regular rate and rhythm.  Abdomen soft without tenderness, guarding, mass or organomegaly.  Neurological is normal, no focal findings.  BREAST EXAM: breasts appear normal, no suspicious masses, no skin or nipple changes or axillary nodes  PELVIC EXAM: VULVA: normal appearing vulva with atrophic changes, no masses, tenderness or lesions, VAGINA: normal appearing vagina with atrophic change, normal color and discharge, no lesions, CERVIX: normal appearing cervix without discharge or lesions, UTERUS: Difficult to palpate but nontender, ADNEXA: Limited exam due to body habitus, nontender and no masses  Chaperone: (DNP student) present during the examination and performed the pelvic exam with me in attendance to confirm the exam findings   ASSESSMENT:  64 y.o. 77 here for annual gynecologic exam  PLAN:   1. Postmenopausal.  No significant menopausal symptoms.  No vaginal bleeding. 2. Pap smear/HPV 02/2019.  History of CIS in 1989, normal Pap smears since then.  Will continue to follow every 2-year interval for a while based on her previous Pap smear surveillance pattern, so the next one will be  due in 2022.  We discussed option of discontinuing surveillance after age 12 and will readdress at her future visits. 3. Mammogram 02/2020.  Normal breast exam today.  Will continue with annual mammograms. 4. Colonoscopy 2020.  She will follow up at the recommended interval per her GI specialist. 5. Osteoporosis.  DEXA 2019.  T score -2.7 distal third of radius.  She had discussion about treatment in the past and elected not to treat. Next DEXA recommended this year so she plans to schedule this, the  test is ordered. 6. Health maintenance.  No labs today as she normally has these completed elsewhere.  Return annually or sooner, prn.  Theresia Majors MD 03/22/20

## 2020-04-03 ENCOUNTER — Other Ambulatory Visit: Payer: Self-pay

## 2020-04-03 ENCOUNTER — Ambulatory Visit (INDEPENDENT_AMBULATORY_CARE_PROVIDER_SITE_OTHER): Payer: BC Managed Care – PPO

## 2020-04-03 ENCOUNTER — Other Ambulatory Visit: Payer: Self-pay | Admitting: Obstetrics and Gynecology

## 2020-04-03 DIAGNOSIS — Z01419 Encounter for gynecological examination (general) (routine) without abnormal findings: Secondary | ICD-10-CM

## 2020-04-03 DIAGNOSIS — M810 Age-related osteoporosis without current pathological fracture: Secondary | ICD-10-CM

## 2020-04-03 DIAGNOSIS — Z780 Asymptomatic menopausal state: Secondary | ICD-10-CM

## 2020-04-24 DIAGNOSIS — L821 Other seborrheic keratosis: Secondary | ICD-10-CM | POA: Diagnosis not present

## 2020-04-24 DIAGNOSIS — L718 Other rosacea: Secondary | ICD-10-CM | POA: Diagnosis not present

## 2020-04-24 DIAGNOSIS — L720 Epidermal cyst: Secondary | ICD-10-CM | POA: Diagnosis not present

## 2020-04-24 DIAGNOSIS — L723 Sebaceous cyst: Secondary | ICD-10-CM | POA: Diagnosis not present
